# Patient Record
Sex: Female | Born: 1965 | Race: White | Hispanic: No | Marital: Married | Smoking: Never smoker
Health system: Southern US, Community
[De-identification: ages and names within clinical notes are randomized; demographics above are authoritative.]

## PROBLEM LIST (undated history)

## (undated) DIAGNOSIS — G2581 Restless legs syndrome: Principal | ICD-10-CM

## (undated) DIAGNOSIS — M26622 Arthralgia of left temporomandibular joint: Secondary | ICD-10-CM

## (undated) DIAGNOSIS — M5412 Radiculopathy, cervical region: Secondary | ICD-10-CM

## (undated) DIAGNOSIS — M50022 Cervical disc disorder at C5-C6 level with myelopathy: Secondary | ICD-10-CM

## (undated) DIAGNOSIS — S82899A Other fracture of unspecified lower leg, initial encounter for closed fracture: Secondary | ICD-10-CM

## (undated) HISTORY — DX: Restless legs syndrome: G25.81

## (undated) HISTORY — DX: Arthralgia of left temporomandibular joint: M26.622

## (undated) HISTORY — DX: Cervical disc disorder at C5-C6 level with myelopathy: M50.022

## (undated) HISTORY — DX: Radiculopathy, cervical region: M54.12

## (undated) HISTORY — DX: Other fracture of unspecified lower leg, initial encounter for closed fracture: S82.899A

## (undated) HISTORY — PX: PELVIC LAPAROSCOPY: SHX162

---

## 2004-01-04 ENCOUNTER — Ambulatory Visit: Payer: Self-pay | Admitting: Family Medicine

## 2007-01-17 ENCOUNTER — Ambulatory Visit: Payer: Self-pay | Admitting: Internal Medicine

## 2007-01-28 ENCOUNTER — Ambulatory Visit: Payer: Self-pay | Admitting: Internal Medicine

## 2007-01-28 DIAGNOSIS — R05 Cough: Secondary | ICD-10-CM

## 2007-01-28 LAB — CONVERTED CEMR LAB
Albumin: 3.6 g/dL (ref 3.5–5.2)
BUN: 11 mg/dL (ref 6–23)
Basophils Relative: 0 % (ref 0.0–1.0)
CO2: 27 meq/L (ref 19–32)
Calcium: 9.2 mg/dL (ref 8.4–10.5)
Chloride: 103 meq/L (ref 96–112)
Creatinine, Ser: 0.9 mg/dL (ref 0.4–1.2)
Eosinophils Absolute: 0.2 10*3/uL (ref 0.0–0.6)
Eosinophils Relative: 2.1 % (ref 0.0–5.0)
GFR calc Af Amer: 90 mL/min
GFR calc non Af Amer: 74 mL/min
Glucose, Bld: 88 mg/dL (ref 70–99)
MCHC: 34.9 g/dL (ref 30.0–36.0)
MCV: 90.1 fL (ref 78.0–100.0)
Neutrophils Relative %: 69.7 % (ref 43.0–77.0)
Platelets: 225 10*3/uL (ref 150–400)
RBC: 4.14 M/uL (ref 3.87–5.11)
Sodium: 139 meq/L (ref 135–145)
Total Bilirubin: 0.5 mg/dL (ref 0.3–1.2)
WBC: 7.9 10*3/uL (ref 4.5–10.5)

## 2007-01-29 ENCOUNTER — Ambulatory Visit: Payer: Self-pay | Admitting: Internal Medicine

## 2007-02-13 ENCOUNTER — Ambulatory Visit: Payer: Self-pay | Admitting: Internal Medicine

## 2007-02-14 LAB — CONVERTED CEMR LAB: Pap Smear: NORMAL

## 2007-06-14 LAB — HM MAMMOGRAPHY: HM Mammogram: NORMAL

## 2007-08-14 ENCOUNTER — Ambulatory Visit: Payer: Self-pay | Admitting: Internal Medicine

## 2007-08-14 DIAGNOSIS — R1907 Generalized intra-abdominal and pelvic swelling, mass and lump: Secondary | ICD-10-CM | POA: Insufficient documentation

## 2007-08-19 ENCOUNTER — Ambulatory Visit (HOSPITAL_BASED_OUTPATIENT_CLINIC_OR_DEPARTMENT_OTHER): Admission: RE | Admit: 2007-08-19 | Discharge: 2007-08-19 | Payer: Self-pay | Admitting: Internal Medicine

## 2007-08-19 ENCOUNTER — Encounter (INDEPENDENT_AMBULATORY_CARE_PROVIDER_SITE_OTHER): Payer: Self-pay | Admitting: *Deleted

## 2007-08-19 LAB — CONVERTED CEMR LAB
AST: 22 units/L (ref 0–37)
Basophils Absolute: 0.1 10*3/uL (ref 0.0–0.1)
Bilirubin, Direct: 0.1 mg/dL (ref 0.0–0.3)
CO2: 27 meq/L (ref 19–32)
Chloride: 108 meq/L (ref 96–112)
Creatinine, Ser: 0.9 mg/dL (ref 0.4–1.2)
Direct LDL: 123.3 mg/dL
Eosinophils Absolute: 0.1 10*3/uL (ref 0.0–0.7)
GFR calc non Af Amer: 74 mL/min
HDL: 66.8 mg/dL (ref >39.0)
MCHC: 34.9 g/dL (ref 30.0–36.0)
MCV: 91.4 fL (ref 78.0–100.0)
Monocytes Absolute: 0.4 10*3/uL (ref 0.1–1.0)
Neutro Abs: 2.2 10*3/uL (ref 1.4–7.7)
Neutrophils Relative %: 51.4 % (ref 43.0–77.0)
Potassium: 4.4 meq/L (ref 3.5–5.1)
RDW: 11.5 % (ref 11.5–14.6)
TSH: 2.22 microintl units/mL (ref 0.35–5.50)
Total Bilirubin: 0.7 mg/dL (ref 0.3–1.2)
Total Protein: 6.8 g/dL (ref 6.0–8.3)
Triglycerides: 165 mg/dL — ABNORMAL HIGH (ref 0–149)

## 2007-08-22 ENCOUNTER — Encounter: Payer: Self-pay | Admitting: Internal Medicine

## 2007-09-30 ENCOUNTER — Ambulatory Visit: Payer: Self-pay | Admitting: Family Medicine

## 2007-09-30 DIAGNOSIS — M546 Pain in thoracic spine: Secondary | ICD-10-CM | POA: Insufficient documentation

## 2008-02-14 DIAGNOSIS — S82899A Other fracture of unspecified lower leg, initial encounter for closed fracture: Secondary | ICD-10-CM

## 2008-02-14 HISTORY — DX: Other fracture of unspecified lower leg, initial encounter for closed fracture: S82.899A

## 2010-03-07 ENCOUNTER — Encounter: Payer: Self-pay | Admitting: Internal Medicine

## 2010-06-13 ENCOUNTER — Ambulatory Visit (INDEPENDENT_AMBULATORY_CARE_PROVIDER_SITE_OTHER): Payer: PRIVATE HEALTH INSURANCE | Admitting: Internal Medicine

## 2010-06-13 ENCOUNTER — Encounter: Payer: Self-pay | Admitting: Internal Medicine

## 2010-06-13 DIAGNOSIS — Z0000 Encounter for general adult medical examination without abnormal findings: Secondary | ICD-10-CM | POA: Insufficient documentation

## 2010-06-13 LAB — BASIC METABOLIC PANEL
BUN: 14 mg/dL (ref 6–23)
CO2: 26 mEq/L (ref 19–32)
Calcium: 9.1 mg/dL (ref 8.4–10.5)
Chloride: 103 mEq/L (ref 96–112)
GFR: 80.84 mL/min (ref >60.00)
Glucose, Bld: 85 mg/dL (ref 70–99)
Potassium: 5.5 mEq/L — ABNORMAL HIGH (ref 3.5–5.1)
Sodium: 139 mEq/L (ref 135–145)

## 2010-06-13 LAB — CBC WITH DIFFERENTIAL/PLATELET
Basophils Relative: 0.8 % (ref 0.0–3.0)
Eosinophils Absolute: 0.1 10*3/uL (ref 0.0–0.7)
Hemoglobin: 13.6 g/dL (ref 12.0–15.0)
MCHC: 34.6 g/dL (ref 30.0–36.0)
MCV: 91.9 fl (ref 78.0–100.0)
Monocytes Absolute: 0.4 10*3/uL (ref 0.1–1.0)
Neutro Abs: 3.2 10*3/uL (ref 1.4–7.7)
RBC: 4.26 Mil/uL (ref 3.87–5.11)

## 2010-06-13 LAB — LIPID PANEL: Cholesterol: 193 mg/dL (ref 0–200)

## 2010-06-13 LAB — TSH: TSH: 2.13 u[IU]/mL (ref 0.35–5.50)

## 2010-06-13 LAB — AST: AST: 19 U/L (ref 0–37)

## 2010-06-13 NOTE — Assessment & Plan Note (Addendum)
Td 7 GF had colon ca dx in his 45s Female care per gynecology Has a healthy lifestyle!  Palpable aorta, ultrasound 7- 07-2007 negative occ CP, atypical, EKG today wnl.Rec to call if sx increase  Labs

## 2010-06-13 NOTE — Progress Notes (Signed)
  Subjective:    Patient ID: Charlotte Petersen, female    DOB: February 06, 1966, 45 y.o.   MRN: [id number]  HPI CPX Past Medical History  Diagnosis Date  . Ankle fracture 2010    no surgery   Past Surgical History  Procedure Date  . Cesarean section   . Pelvic laparoscopy 1990s   Family History  Problem Relation Age of Onset  . Coronary artery disease Paternal Grandmother     <I  . Throat cancer Paternal Grandfather   . Colon cancer Maternal Grandmother 11  . Lupus Maternal Aunt   . Hypertension Maternal Grandmother   . Heart murmur Father     F and B (MV repair)  . Diabetes Neg Hx   . Stroke Neg Hx   . Breast cancer      aunt   . Lupus      aunt   History   Social History  . Marital Status: Married    Spouse Name: N/A    Number of Children: 2  . Years of Education: N/A   Occupational History  . ice cream maker     Social History Main Topics  . Smoking status: Never Smoker   . Smokeless tobacco: Not on file  . Alcohol Use: 0.0 oz/week     socially   . Drug Use: No  . Sexually Active: Not on file   Other Topics Concern  . Not on file   Social History Narrative   Exercise- active @ work      Review of Systems Active at work, aches and pains from time to time No  N-V- D, no blood in stools Sporadically has a anterior, non radiating, "hurt" in the midle of the chest, no radiation, no assoc. Sx, last "few seconds", usually associated to stress. Denies anxiety-depression per se  No GERD sx     Objective:   Physical Exam  Constitutional: She is oriented to person, place, and time. She appears well-developed and well-nourished. No distress.  HENT:  Head: Normocephalic and atraumatic.  Eyes: No scleral icterus.  Neck: Normal range of motion. Neck supple. No thyromegaly present.       Normal carotid pulse   Cardiovascular: Normal rate, regular rhythm and normal heart sounds.   No murmur heard. Pulmonary/Chest: Effort normal and breath sounds normal. No  respiratory distress. She has no wheezes.  Abdominal: Soft. Bowel sounds are normal. She exhibits no distension. There is no tenderness. There is no rebound.       Non tender palpable Ao on the epigastrium  Musculoskeletal: She exhibits no edema.  Neurological: She is alert and oriented to person, place, and time.  Skin: Skin is warm and dry.  Psychiatric: She has a normal mood and affect. Her behavior is normal. Judgment and thought content normal.          Assessment & Plan:

## 2010-06-15 ENCOUNTER — Telehealth: Payer: Self-pay | Admitting: *Deleted

## 2010-06-15 NOTE — Telephone Encounter (Signed)
Message copied by Army Fossa on Wed Jun 15, 2010  1:12 PM ------      Message from: Willow Ora      Created: Wed Jun 15, 2010 11:59 AM       Advise patient.      Her cholesterol is very good, just the triglycerides are slightly high. A healthy diet and daily exercise will help.      Her potassium is slightly high. Please send the patient a low potassium diet. If she is taking any "salt substitutes" or over-the-counter potassium I recommend her to stop.      Arrange for a potassium check in 2 months----- DX hyperkalemia.      All other labs are within normal.      Good results!

## 2010-06-15 NOTE — Telephone Encounter (Signed)
Message left for patient to return my call.  

## 2010-06-16 NOTE — Telephone Encounter (Signed)
Pt is aware.  

## 2010-08-12 ENCOUNTER — Other Ambulatory Visit: Payer: Self-pay | Admitting: Internal Medicine

## 2010-08-12 DIAGNOSIS — E875 Hyperkalemia: Secondary | ICD-10-CM

## 2010-08-15 ENCOUNTER — Other Ambulatory Visit (INDEPENDENT_AMBULATORY_CARE_PROVIDER_SITE_OTHER): Payer: PRIVATE HEALTH INSURANCE

## 2010-08-15 DIAGNOSIS — E875 Hyperkalemia: Secondary | ICD-10-CM

## 2010-08-15 LAB — POTASSIUM: Potassium: 4.3 mEq/L (ref 3.5–5.1)

## 2010-08-15 NOTE — Progress Notes (Signed)
Labs only

## 2010-08-16 ENCOUNTER — Telehealth: Payer: Self-pay | Admitting: *Deleted

## 2010-08-16 NOTE — Telephone Encounter (Signed)
Message left for patient to return my call.  

## 2010-08-16 NOTE — Telephone Encounter (Signed)
Message copied by Leanne Lovely on Tue Aug 16, 2010  2:03 PM ------      Message from: Willow Ora E      Created: Tue Aug 16, 2010  1:14 PM       Advise patient: K+ is ok

## 2010-08-16 NOTE — Telephone Encounter (Signed)
Pt aware of labs  

## 2010-12-27 LAB — HM PAP SMEAR

## 2012-02-14 LAB — HM MAMMOGRAPHY

## 2012-10-24 ENCOUNTER — Encounter: Payer: Self-pay | Admitting: Internal Medicine

## 2012-10-24 ENCOUNTER — Ambulatory Visit (INDEPENDENT_AMBULATORY_CARE_PROVIDER_SITE_OTHER): Payer: PRIVATE HEALTH INSURANCE | Admitting: Internal Medicine

## 2012-10-24 VITALS — BP 144/85 | HR 74 | Temp 98.7°F | Ht 62.5 in | Wt 156.8 lb

## 2012-10-24 DIAGNOSIS — Z0000 Encounter for general adult medical examination without abnormal findings: Secondary | ICD-10-CM

## 2012-10-24 DIAGNOSIS — G2581 Restless legs syndrome: Secondary | ICD-10-CM

## 2012-10-24 DIAGNOSIS — R252 Cramp and spasm: Secondary | ICD-10-CM

## 2012-10-24 HISTORY — DX: Restless legs syndrome: G25.81

## 2012-10-24 LAB — CBC WITH DIFFERENTIAL/PLATELET
Basophils Relative: 0.4 % (ref 0.0–3.0)
Eosinophils Absolute: 0.1 10*3/uL (ref 0.0–0.7)
HCT: 40.8 % (ref 36.0–46.0)
Hemoglobin: 13.9 g/dL (ref 12.0–15.0)
Lymphocytes Relative: 26.4 % (ref 12.0–46.0)
Lymphs Abs: 2.3 10*3/uL (ref 0.7–4.0)
MCHC: 34.1 g/dL (ref 30.0–36.0)
MCV: 90.6 fl (ref 78.0–100.0)
Monocytes Absolute: 0.6 10*3/uL (ref 0.1–1.0)
Neutro Abs: 5.6 10*3/uL (ref 1.4–7.7)
RBC: 4.5 Mil/uL (ref 3.87–5.11)

## 2012-10-24 LAB — COMPREHENSIVE METABOLIC PANEL
AST: 18 U/L (ref 0–37)
Alkaline Phosphatase: 41 U/L (ref 39–117)
BUN: 15 mg/dL (ref 6–23)
Creatinine, Ser: 0.8 mg/dL (ref 0.4–1.2)
Total Bilirubin: 0.7 mg/dL (ref 0.3–1.2)

## 2012-10-24 LAB — TSH: TSH: 1.42 u[IU]/mL (ref 0.35–5.50)

## 2012-10-24 LAB — LIPID PANEL: HDL: 73 mg/dL (ref >39.00)

## 2012-10-24 LAB — FOLATE: Folate: >24.8 ng/mL (ref >5.9)

## 2012-10-24 LAB — MAGNESIUM: Magnesium: 1.8 mg/dL (ref 1.5–2.5)

## 2012-10-24 MED ORDER — CLONAZEPAM 0.5 MG PO TABS: 0.5000 mg | ORAL_TABLET | Freq: Every evening | ORAL | Status: DC | PRN

## 2012-10-24 NOTE — Patient Instructions (Signed)
Take clonazepam as needed restless leg, will cause drowsiness, can not take if pregnant Get your blood work before you leave  Next visit in  2 months for a check up (RLS) Please make an appointment before you leave the office today (or call few weeks in advance)

## 2012-10-24 NOTE — Progress Notes (Signed)
  Subjective:    Patient ID: Charlotte Petersen, female    DOB: 1965/02/25, 47 y.o.   MRN: [id number]  HPI CPX  Past Medical History  Diagnosis Date  . Ankle fracture 2010    no surgery   Past Surgical History  Procedure Laterality Date  . Cesarean section    . Pelvic laparoscopy  1990s   Family History  Problem Relation Age of Onset  . Coronary artery disease Paternal Grandmother     <I  . Throat cancer Paternal Grandfather   . Colon cancer Other 88    GF  . Lupus Maternal Aunt   . Hypertension Maternal Grandmother   . Heart murmur Father     F and B (MV repair)  . Diabetes Neg Hx   . Stroke Neg Hx   . Breast cancer Other     aunt    History   Social History  . Marital Status: Married    Spouse Name: N/A    Number of Children: 2  . 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 Smoking status: Never Smoker   . Smokeless tobacco: Never Used  . Alcohol Use: 0.0 oz/week     Comment: socially   . Drug Use: No  . Sexual Activity: Not on file   Other Topics Concern  . Not on file   Social History Narrative   Live w/ husband     Review of Systems Still has "leg cramps", symptoms are almost exclusively at night described as   "can't relax her legs, blood pumps very hard"; Symptoms somehow better with Advil. No back pain, claudication. No chest pain or shortness or breath No  nausea, vomiting, diarrhea. No dysuria, gross hematuria. Also reports left tinnitus, on and off, mild, for 8 months. Denies decreased hearing.    Objective:   Physical Exam BP 144/85  Pulse 74  Temp(Src) 98.7 F (37.1 C)  Ht 5' 2.5" (1.588 m)  Wt 156 lb 12.8 oz (71.124 kg)  BMI 28.2 kg/m2  SpO2 98%  General -- alert, well-developed, NAD.  Neck --no thyromegaly , normal carotid pulse   HEENT-- Not pale. TMs no perf, no red or bulge. Lungs -- normal respiratory effort, no intercostal retractions, no accessory muscle use, and  normal breath sounds.  Heart-- normal rate, regular rhythm, no murmur.  Abdomen-- Not distended, good bowel sounds,soft, non-tender. No rebound or rigidity.  Extremities-- no pretibial edema bilaterally ; normal pedal pulses Neurologic-- alert & oriented X3. Speech, gait normal. Psych-- Cognition and judgment appear intact. Alert and cooperative with normal attention span and concentration. not anxious appearing and not depressed appearing.       Assessment & Plan:

## 2012-10-24 NOTE — Assessment & Plan Note (Addendum)
Td 05 GF had colon ca dx in his 104s Female care per gynecology, still taking her control pills, recommend to discuss with gynecology Diet-exercise discussed  Palpable aorta, ultrasound 7- 07-2007 negative tinnitus observation, if sx increase or decreased hearing--->  needs to see ENT Labs  BP 144/85, BP in the ambulatory setting reportedly 110/70.

## 2012-10-24 NOTE — Assessment & Plan Note (Addendum)
Ongoing symptoms, will check labs. CK, Mg, iron, b12, vit d Atypical RLS? Trial with clonazepam

## 2012-10-25 ENCOUNTER — Encounter: Payer: Self-pay | Admitting: Internal Medicine

## 2012-10-25 LAB — LDL CHOLESTEROL, DIRECT: Direct LDL: 119.1 mg/dL

## 2012-12-19 ENCOUNTER — Other Ambulatory Visit: Payer: Self-pay

## 2012-12-25 ENCOUNTER — Encounter: Payer: Self-pay | Admitting: Lab

## 2012-12-26 ENCOUNTER — Encounter: Payer: Self-pay | Admitting: Internal Medicine

## 2012-12-26 ENCOUNTER — Ambulatory Visit (INDEPENDENT_AMBULATORY_CARE_PROVIDER_SITE_OTHER): Payer: PRIVATE HEALTH INSURANCE | Admitting: Internal Medicine

## 2012-12-26 VITALS — BP 145/83 | HR 78 | Temp 98.3°F | Wt 161.0 lb

## 2012-12-26 DIAGNOSIS — G2581 Restless legs syndrome: Secondary | ICD-10-CM

## 2012-12-26 DIAGNOSIS — Z23 Encounter for immunization: Secondary | ICD-10-CM

## 2012-12-26 MED ORDER — CLONAZEPAM 0.5 MG PO TABS: 0.5000 mg | ORAL_TABLET | Freq: Every evening | ORAL | Status: DC | PRN

## 2012-12-26 NOTE — Patient Instructions (Addendum)
Get your UDS before you leave  Next visit in 6 months  for a RLS  follow up. No Fasting Please make an appointment

## 2012-12-26 NOTE — Assessment & Plan Note (Signed)
symptoms likely due to RLS, she responded well to clonazepam without apparent side effects. Labs requested last visit were normal. Plan: Clonazepam RF Next visit in 6 months UDS today,  Noting she has not been taking clonazepam lately because she ran out and I anticipate clonazepam will not be present in the sample

## 2012-12-26 NOTE — Progress Notes (Signed)
  Subjective:    Patient ID: Charlotte Petersen, female    DOB: 1965-03-26, 47 y.o.   MRN: [id number]  HPI F/u from previous visit RLS-- took clonazepam with excellent response of her symptoms, ran out a few weeks ago symptoms came back (not as severe as before , thinks is b/c she changed jobs and is not on her feet a lot); This time describing symptoms as cramps and twitchy  muscles    Past Medical History  Diagnosis Date  . Ankle fracture 2010    no surgery   Past Surgical History  Procedure Laterality Date  . Cesarean section    . Pelvic laparoscopy  1990s      Review of Systems Not excesively  sleepy w/  clonazepam. Ambulatory BPs within normal 120/80 or better Denies anxiety and depression per se. Still has occasional tinnitus, not severe: Decided not to see ENT.     Objective:   Physical Exam BP 145/83  Pulse 78  Temp(Src) 98.3 F (36.8 C)  Wt 161 lb (73.029 kg)  SpO2 99% General -- alert, well-developed, NAD.   Lungs -- normal respiratory effort, no intercostal retractions, no accessory muscle use, and normal breath sounds.  Heart-- normal rate, regular rhythm, no murmur.  Neurologic--  alert & oriented X3. Speech normal, gait normal, strength normal in all extremities.  Psych-- Cognition and judgment appear intact. Cooperative with normal attention span and concentration. No anxious appearing , no depressed appearing.     Assessment & Plan:

## 2012-12-26 NOTE — Progress Notes (Signed)
Pre visit review using our clinic review tool, if applicable. No additional management support is needed unless otherwise documented below in the visit note. 

## 2013-01-07 ENCOUNTER — Telehealth: Payer: Self-pay | Admitting: *Deleted

## 2013-01-07 NOTE — Telephone Encounter (Signed)
UDS results on 12/27/12 deemed Low Risk by provider

## 2013-02-10 ENCOUNTER — Telehealth: Payer: Self-pay | Admitting: *Deleted

## 2013-02-10 NOTE — Telephone Encounter (Signed)
Patient stated that she would like to stop taking the Clonazepam and would like instructions of how to take to completely stop it. Please advise.

## 2013-02-10 NOTE — Telephone Encounter (Signed)
If she is only taking 1 a day she can just stop

## 2013-02-10 NOTE — Telephone Encounter (Signed)
Patient called and stated that she is on clonazePAM (KLONOPIN) 0.5 MG tablet and read on the bottom if you want to stop taking it to let your provider know. Patient would like to see if she could stop taking it.

## 2013-02-10 NOTE — Telephone Encounter (Signed)
duplicate

## 2013-02-17 ENCOUNTER — Encounter: Payer: Self-pay | Admitting: Internal Medicine

## 2013-02-18 ENCOUNTER — Telehealth: Payer: Self-pay | Admitting: Internal Medicine

## 2013-02-18 NOTE — Telephone Encounter (Signed)
See e-mail from patient below. Charlotte Petersen call patient:  to take clonazepam half tablet each bedtime for 2 weeks and then only as needed. Call #[phone] and one refill. --------- I am wanting to stop taking the clonzepam and have 9 pills left before another refill. I am told that I cannot just stop taking it and need to wean off of this drug slowly. I don't have any side effects taking this drug, but I feel that it isn't really working and that is why I want to stop taking it. How can I go about weaning off of this controlled-substance before all 9 pills are gone?

## 2013-02-18 NOTE — Telephone Encounter (Signed)
Pt understands and agrees with plan. Pt states has two refills left.

## 2013-02-24 ENCOUNTER — Encounter: Payer: Self-pay | Admitting: Internal Medicine

## 2013-06-24 ENCOUNTER — Ambulatory Visit: Payer: PRIVATE HEALTH INSURANCE | Admitting: Internal Medicine

## 2013-08-04 ENCOUNTER — Telehealth: Payer: Self-pay | Admitting: Internal Medicine

## 2013-08-04 DIAGNOSIS — G2581 Restless legs syndrome: Secondary | ICD-10-CM

## 2013-08-04 NOTE — Telephone Encounter (Signed)
Advised marj from [facility] referrals of patient requesting to change provider for her neurologist referral to Dr. Allena KatzPatel from [place].

## 2013-08-04 NOTE — Telephone Encounter (Signed)
Please arrange a OV before referral

## 2013-08-04 NOTE — Telephone Encounter (Signed)
Caller name: Sterling BigSharon Darsey Relation to pt: patient Call back number: [phone] Pharmacy:  Reason for call: patient called to request a referral to a Neurologist. Patient feels like she may have PNH. Please advise.

## 2013-08-04 NOTE — Telephone Encounter (Signed)
Pt states she already has a referral put in for a neurologist by Dr.Manor but she doesn't want to see the P.A from [facility] that they referred her too. Pt requesting to see Dr. Allena KatzPatel in [place].pt states the clonazepam doesn't work for her RLS and makes her sleepy. Pt thinks she has PNH, she's usually up all night and okay during the day.

## 2013-08-04 NOTE — Telephone Encounter (Addendum)
Onalee Huaavid, Please enter referral

## 2013-08-04 NOTE — Telephone Encounter (Signed)
Dr Drue NovelPaz, Since this original referral was done by a doctor other than you, I will need you to put in a referral for Dr Allena KatzPatel.

## 2013-08-04 NOTE — Addendum Note (Signed)
Addended by: Eustace QuailEABOLD, DAVID J on: 08/04/2013 11:04 AM   Modules accepted: Orders

## 2013-08-04 NOTE — Telephone Encounter (Signed)
Referral ordered

## 2013-08-05 ENCOUNTER — Encounter: Payer: Self-pay | Admitting: Internal Medicine

## 2013-08-26 LAB — HM MAMMOGRAPHY

## 2013-10-09 ENCOUNTER — Ambulatory Visit (INDEPENDENT_AMBULATORY_CARE_PROVIDER_SITE_OTHER): Payer: PRIVATE HEALTH INSURANCE | Admitting: Neurology

## 2013-10-09 ENCOUNTER — Encounter: Payer: Self-pay | Admitting: Neurology

## 2013-10-09 VITALS — BP 120/84 | HR 60 | Ht 62.0 in | Wt 149.6 lb

## 2013-10-09 DIAGNOSIS — R252 Cramp and spasm: Secondary | ICD-10-CM

## 2013-10-09 DIAGNOSIS — R259 Unspecified abnormal involuntary movements: Secondary | ICD-10-CM

## 2013-10-09 DIAGNOSIS — R253 Fasciculation: Secondary | ICD-10-CM

## 2013-10-09 LAB — CALCIUM: Calcium: 9.5 mg/dL (ref 8.4–10.5)

## 2013-10-09 LAB — CK: CK TOTAL: 60 U/L (ref 7–177)

## 2013-10-09 MED ORDER — GABAPENTIN 300 MG PO CAPS: 300.0000 mg | ORAL_CAPSULE | Freq: Every day | ORAL | Status: DC

## 2013-10-09 NOTE — Patient Instructions (Addendum)
1.  Check blood work 2.  EMG of the right arm and leg 3.  Start magnesium oxide  daily for one week 4.  If no improvement with magnesium, start taking gabapentin  at bedtime 5.  Return to clinic in 79-months

## 2013-10-09 NOTE — Progress Notes (Signed)
[facility] Clinic Note - Initial Visit   Date: 10/09/2013  [name] MRN: [id number] DOB: Mar 09, 1965   Dear Dr Drue Novel:   Thank you for your kind referral of Charlotte Petersen for consultation of muscle cramps. Although his history is well known to you, please allow [place] to reiterate it for the purpose of our medical record. The patient was accompanied to the clinic by husband who also provides collateral information.     History of Present Illness: Charlotte Petersen is a 48 y.o. left-handed Caucasian female with no prior medical history presenting for evaluation of muscle cramps and twitches.    Starting in 2010, she developed cramping of the legs (thigh, calf, and feet).  She was working at [employer] and thought it was because she was on her feet all day.  In October 2014, she transitioned jobs to a desk position at [employer] that it would help her symptoms, but there has been no difference.  She complains of achy legs, painless muscle twitches, and muscle cramps (occuring at night).  She feels as if she has a "bag of worms" in her legs.  Symptoms are more noticable at rest, but is not aggravated by rest.  She wakes up around 2am with muscle cramps.  She usually gets up to walk around and tries to stretch the muscles.  She has no problems falling asleep. She does not have the urge to move her legs or restlessness.  Symptoms are improved by a hot shower.  She has tried doing posterior leg stretches, but this has no helped.  She was given a trial of clonazepam, which only made her tired without any change in her muscle cramps. She tried drinking tonic water but stopped because her potassium increased and does not recall if it helped. No numbness/tingling or weakness of the legs.  She has rare muscle twitches of the arms.  Her mother and brother have similar symptoms, but mother's cramps resolved after back surgery.  She has lost 14lb since  January by following diet program.   Out-side paper records, electronic medical record, and images have been reviewed where available and summarized as:  Lab Results  Component Value Date   TSH 1.42 10/24/2012   Lab Results  Component Value Date   VITAMINB12 674 10/24/2012   Lab Results  Component Value Date   CKTOTAL 116 10/24/2012      Past Medical History  Diagnosis Date  . Ankle fracture 2010    no surgery  . RLS (restless legs syndrome) 10/24/2012    Past Surgical History  Procedure Laterality Date  . Cesarean section    . Pelvic laparoscopy  1990s     Medications:  Current Outpatient Prescriptions on File Prior to Visit  Medication Sig Dispense Refill  . b complex vitamins tablet Take 1 tablet by mouth daily.        . drospirenone-ethinyl estradiol (YASMIN) 3-0.03 MG per tablet Take 1 tablet by mouth daily.        . Ibuprofen (ADVIL) 200 MG CAPS Take by mouth.      . Lysine 500 MG CAPS Take by mouth.        Marland Kitchen VITAMIN D, CHOLECALCIFEROL, PO Take 1,000 mg by mouth.       No current facility-administered medications on file prior to visit.    Allergies: No Known Allergies  Family History: Family History  Problem Relation Age of Onset  . Coronary artery disease Paternal  Grandmother     <I  . Throat cancer Paternal Grandfather   . Colon cancer Other 88    GF  . Lupus Maternal Aunt   . Hypertension Maternal Grandmother   . Heart murmur Father     F and B (MV repair)  . Diabetes Neg Hx   . Stroke Neg Hx   . Breast cancer Other     aunt   . Arthritis Mother     Social History: History   Social History  . Marital Status: Married    Spouse Name: N/A    Number of Children: 2  . 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 Smoking status: Never Smoker   . Smokeless tobacco: Never Used  . Alcohol Use: 0.0 oz/week     Comment: socially   . Drug Use: No  . Sexual Activity: Not on file    Other Topics Concern  . Not on file   Social History Narrative   Live w/ husband    She works as Futures trader as a Software engineer.    Review of Systems:  CONSTITUTIONAL: No fevers, chills, night sweats, + 14lb intentional weight loss.   EYES: No visual changes or eye pain ENT: No hearing changes.  No history of nose bleeds.   RESPIRATORY: No cough, wheezing and shortness of breath.   CARDIOVASCULAR: Negative for chest pain, and palpitations.   GI: Negative for abdominal discomfort, blood in stools or black stools.  No recent change in bowel habits.   GU:  No history of incontinence.   MUSCLOSKELETAL: No history of joint pain or swelling.  No myalgias.   SKIN: Negative for lesions, rash, and itching.   HEMATOLOGY/ONCOLOGY: Negative for prolonged bleeding, bruising easily, and swollen nodes.  No history of cancer.   ENDOCRINE: Negative for cold or heat intolerance, polydipsia or goiter.   PSYCH:  No depression +anxiety symptoms.   NEURO: As Above.   Vital Signs:  BP 120/84  Pulse 60  Ht  (1.575 m)  Wt 149 lb 9 oz (67.841 kg)  BMI 27.35 kg/m2  SpO2 99% Pain Scale: 0 on a scale of 0-10   General Medical Exam:   General:  Well appearing, comfortable.   Eyes/ENT: see cranial nerve examination.   Neck: No masses appreciated.  Full range of motion without tenderness.  No carotid bruits. Respiratory:  Clear to auscultation, good air entry bilaterally.   Cardiac:  Regular rate and rhythm, no murmur.     Extremities:  No deformities, edema, or skin discoloration. Good capillary refill.   Skin:  Skin color, texture, turgor normal. No rashes or lesions.  Neurological Exam: MENTAL STATUS including orientation to time, place, person, recent and remote memory, attention span and concentration, language, and fund of knowledge is normal.  Speech is not dysarthric.  CRANIAL NERVES: II:  No visual field defects.  Unremarkable fundi.   III-IV-VI: Pupils equal round  and reactive to light.  Normal conjugate, extra-ocular eye movements in all directions of gaze.  No nystagmus.  No ptosis. No eyelid myotonia. V:  Normal facial sensation.  Jaw jerk is absent   VII:  Normal facial symmetry and movements.  No pathologic facial reflexes.  VIII:  Normal hearing and vestibular function.   IX-X:  Normal palatal movement.   XI:  Normal shoulder shrug and head rotation.   XII:  Normal tongue strength and  range of motion, no deviation or fasciculation.  MOTOR:  No atrophy or fasciculations.  Right quadriceps cramp, lasting a few seconds, provoked by quadriceps testing. No pronator drift.  Tone is normal.  No percussion myotonia.  Right Upper Extremity:    Left Upper Extremity:    Deltoid  5/5   Deltoid  5/5   Biceps  5/5   Biceps  5/5   Triceps  5/5   Triceps  5/5   Wrist extensors  5/5   Wrist extensors  5/5   Wrist flexors  5/5   Wrist flexors  5/5   Finger extensors  5/5   Finger extensors  5/5   Finger flexors  5/5   Finger flexors  5/5   Dorsal interossei  5/5   Dorsal interossei  5/5   Abductor pollicis  5/5   Abductor pollicis  5/5   Tone (Ashworth scale)  0  Tone (Ashworth scale)  0   Right Lower Extremity:    Left Lower Extremity:    Hip flexors  5/5   Hip flexors  5/5   Hip extensors  5/5   Hip extensors  5/5   Knee flexors  5/5   Knee flexors  5/5   Knee extensors  5/5   Knee extensors  5/5   Dorsiflexors  5/5   Dorsiflexors  5/5   Plantarflexors  5/5   Plantarflexors  5/5   Toe extensors  5/5   Toe extensors  5/5   Toe flexors  5/5   Toe flexors  5/5   Tone (Ashworth scale)  0  Tone (Ashworth scale)  0   MSRs:  Right                                                                 Left brachioradialis 2+  brachioradialis 2+  biceps 2+  biceps 2+  triceps 2+  triceps 2+  patellar 2+  patellar 2+  ankle jerk 2+  ankle jerk 2+  Hoffman no  Hoffman no  plantar response down  plantar response down   SENSORY:  Normal and symmetric perception  of light touch, pinprick, vibration, and proprioception.  Romberg's sign absent.   COORDINATION/GAIT: Normal finger-to- nose-finger and heel-to-shin.  Intact rapid alternating movements bilaterally.  Able to rise from a chair without using arms.  Gait narrow based and stable. Tandem and stressed gait intact.    IMPRESSION/PLAN: Mrs. Ingles is a 48 year-old female presenting for 5-year history of muscle cramps and twitches involving her legs.  Neurological examination is normal and non-lateralizing.  She did have one muscle cramp during the exam involving the right vastus lateralis.  I did not see any fasciculations on exam.    She is not taking any medications which could potentially cause muscle cramps. 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  I would like to obtain EMG of the right arm and leg to be sure other nerve hyperexcitability syndrome is not missed.  She has already had testing for TSH, CK, vitamin B12, CK, CMP, and Mg which was normal in 2014. For completeness, I will check PTH, vitamin E, and repeat CK as well as Ca.  Regarding symptom management, I recommended she start MgO  daily.  If  no improvement, then take gabapentin  qhs.  Other options:  elavil 25-100 q HS, Riboflavin , Benadryl , flexaril 5 mg at night, carbamazepine.  I will see her back after the above testing.   The duration of this appointment visit was 45 minutes of face-to-face time with the patient.  Greater than 50% of this time was spent in counseling, explanation of diagnosis, planning of further management, and coordination of care.   Thank you for allowing me to participate in patient's care.  If I can answer any additional questions, I would be pleased to do so.    Sincerely,    Donika K. Allena Katz, DO

## 2013-10-10 LAB — PARATHYROID HORMONE, INTACT (NO CA): PTH: 36 pg/mL (ref 14–64)

## 2013-10-11 LAB — VITAMIN E
Gamma-Tocopherol (Vit E): <0.2 mg/L (ref <=4.3)
VITAMIN E (ALPHA TOCOPHEROL): 19.7 mg/L (ref 5.7–19.9)

## 2013-11-26 ENCOUNTER — Ambulatory Visit (INDEPENDENT_AMBULATORY_CARE_PROVIDER_SITE_OTHER): Payer: PRIVATE HEALTH INSURANCE | Admitting: Neurology

## 2013-11-26 DIAGNOSIS — R252 Cramp and spasm: Secondary | ICD-10-CM

## 2013-11-26 DIAGNOSIS — R253 Fasciculation: Secondary | ICD-10-CM

## 2013-11-26 NOTE — Procedures (Signed)
[facility]  [address], [address]  [place], [place] [address] Tel: [phone] Fax:  [phone] Test Date:  11/26/2013  Patient: Charlotte Petersen DOB: 11/29/1965 Physician: Nita Sickleonika Patel, DO  Sex: Female Height: 5\' 2"  Ref Phys: Nita Sickleatel, Donika  ID#: [id number] Temp: 34.0C Technician: Ala BentSusan Reid R. NCS T.   Patient Complaints: Patient is a 48 year old female presenting for evaluation of generalized muscle twitching and cramping.   NCV & EMG Findings: Extensive electrodiagnostic testing of the left upper and lower extremity reveals:  1. Median, ulnar, radial, and palmar sensory responses are within normal limits.  2. Median and ulnar motor responses are within normal limits.  3. Sural and superficial peroneal sensory responses are within normal limits.  4. Tibial and peroneal motor responses are within normal limits.  5. There is no evidence of active or chronic motor axon loss changes affecting any of the tested muscles. Motor unit configuration and recruitment pattern is within normal limits.  6. Fasciculation potentials are not seen in any of the tested muscles.  Impression: This is a normal study of the left upper and lower extremity. In particular, there is no evidence of a generalized polyneuropathy, diffuse myopathy, or hyperexcitability syndrome.   ___________________________ Nita Sickleonika Patel, DO    Nerve Conduction Studies Anti Sensory Summary Table   Site NR Peak (ms) Norm Peak (ms) P-T Amp (V) Norm P-T Amp  Left Median Anti Sensory (2nd Digit)  34C  Wrist    2.8 <3.4 34.0 >20  Left Radial Anti Sensory (Base 1st Digit)  34C  Wrist    2.1 <2.7 28.6 >18  Left Sup Peroneal Anti Sensory (Ant Lat Mall)  32C  12 cm    2.3 <4.5 11.4 >5  Left Sural Anti Sensory (Lat Mall)  Calf    3.6 <4.5 11.8 >5  Left Ulnar Anti Sensory (5th Digit)  34C  Wrist    2.5 <3.1 29.3 >12   Motor Summary Table   Site NR Onset (ms) Norm Onset (ms) O-P Amp (mV) Norm O-P Amp  Site1 Site2 Delta-0 (ms) Dist (cm) Vel (m/s) Norm Vel (m/s)  Left Median Motor (Abd Poll Brev)  34C  Wrist    2.5 <3.9 11.3 >6 Elbow Wrist 4.1 24.0 59 >50  Elbow    6.6  10.3         Left Peroneal Motor (Ext Dig Brev)  32C  Ankle    3.9 <5.5 5.4 >3 B Fib Ankle 5.7 27.0 47 >40  B Fib    9.6  5.0  Poplt B Fib 1.8 10.0 56 >40  Poplt    11.4  5.0         Left Peroneal TA Motor (Tib Ant)  34C  Fib Head    2.8 <4.0 5.3 >4 Poplit Fib Head 1.6 8.0 50 >40  Poplit    4.4  5.3         Left Tibial Motor (Abd Hall Brev)  32C  Ankle    3.9 <6.0 11.2 >8 Knee Ankle 7.9 34.0 43 >40  Knee    11.8  7.6         Left Ulnar Motor (Abd Dig Minimi)  34C  Wrist    2.0 <3.1 12.1 >7 B Elbow Wrist 3.2 20.5 64 >50  B Elbow    5.2  11.6  A Elbow B Elbow 1.4 10.0 71 >50  A Elbow    6.6  11.1  Comparison Summary Table   Site NR Peak (ms) Norm Peak (ms) P-T Amp (V) Site1 Site2 Delta-P (ms) Norm Delta (ms)  Left Median/Ulnar Palm Comparison (Wrist - 8cm)  34C  Median Palm    1.9 <2.2 39.8 Median Palm Ulnar Palm 0.2   Ulnar Palm    1.7 <2.2 26.7       EMG   Side Muscle Ins Act Fibs Psw Fasc Number Recrt Dur Dur. Amp Amp. Poly Poly. Comment  Left AntTibialis Nml Nml Nml Nml Nml Nml Nml Nml Nml Nml Nml Nml N/A  Left Gastroc Nml Nml Nml Nml Nml Nml Nml Nml Nml Nml Nml Nml N/A  Left Flex Dig Long Nml Nml Nml Nml Nml Nml Nml Nml Nml Nml Nml Nml N/A  Left RectFemoris Nml Nml Nml Nml Nml Nml Nml Nml Nml Nml Nml Nml N/A  Left Lumbo Parasp Low Nml Nml Nml Nml Nml Nml Nml Nml Nml Nml Nml Nml N/A  Left GluteusMed Nml Nml Nml Nml Nml Nml Nml Nml Nml Nml Nml Nml N/A  Left Cervical Parasp Low Nml Nml Nml Nml Nml Nml Nml Nml Nml Nml Nml Nml N/A  Left 1stDorInt Nml Nml Nml Nml Nml Nml Nml Nml Nml Nml Nml Nml N/A  Left Ext Indicis Nml Nml Nml Nml Nml Nml Nml Nml Nml Nml Nml Nml N/A  Left PronatorTeres Nml Nml Nml Nml Nml Nml Nml Nml Nml Nml Nml Nml N/A  Left Biceps Nml Nml Nml Nml Nml Nml Nml Nml Nml Nml Nml Nml  N/A  Left Triceps Nml Nml Nml Nml Nml Nml Nml Nml Nml Nml Nml Nml N/A  Left Deltoid Nml Nml Nml Nml Nml Nml Nml Nml Nml Nml Nml Nml N/A      Waveforms:

## 2013-12-15 ENCOUNTER — Encounter: Payer: Self-pay | Admitting: Neurology

## 2013-12-17 ENCOUNTER — Ambulatory Visit (INDEPENDENT_AMBULATORY_CARE_PROVIDER_SITE_OTHER): Payer: PRIVATE HEALTH INSURANCE | Admitting: Neurology

## 2013-12-17 ENCOUNTER — Encounter: Payer: Self-pay | Admitting: Neurology

## 2013-12-17 DIAGNOSIS — G709 Myoneural disorder, unspecified: Secondary | ICD-10-CM

## 2013-12-17 DIAGNOSIS — R253 Fasciculation: Secondary | ICD-10-CM

## 2013-12-17 NOTE — Patient Instructions (Addendum)
1.  Continue gabapentin 300mg  at bedtime and MgO 400mg  every other day 2.  You can try to use heating pad at night time to see if this helps 3.  Return to clinic in 2-3 months

## 2013-12-17 NOTE — Progress Notes (Signed)
Follow-up Visit   Date: 12/17/2013    Charlotte Petersen: [id number] DOB: 09/20/1965   Interim History: Charlotte Petersen with no prior medical history returning to the clinic for follow-up of muscle cramps and twitches.    History of present illness: Starting in 2010, she developed cramping of the legs (thigh, calf, and feet). She was working at [employer] and thought it was because she was on her feet all day. 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  She complains of achy legs, painless muscle twitches, and muscle cramps (occuring at night). She feels as if she has a "bag of worms" in her legs. Symptoms are more noticable at rest, but is not aggravated by rest. She wakes up around 2am with muscle cramps. She usually gets up to walk around and tries to stretch the muscles. She has no problems falling asleep. She does not have the urge to move her legs or restlessness. Symptoms are improved by a hot shower. She has tried doing posterior leg stretches, but this has no helped. She was given a trial of clonazepam, which only made her tired without any change in her muscle cramps. She tried drinking tonic water but stopped because her potassium increased and does not recall if it helped. No numbness/tingling or weakness of the legs. She has rare muscle twitches of the arms.  Her mother and brother have similar symptoms, but mother's cramps resolved after back surgery.  UPDATE 12/17/2013:  She delayed taking her gabapentin until after her son's wedding which was 2 weeks ago.  She has noticed that the "worm sensation" has reduced during the day and is able to manage her cramps better at night time.  Previously she was getting up out of bed several times per night and now she is getting up 1-2 times per week.  She has noticed  intensity of symptoms are reduced (9/10 >> 7/10).  EMG of the left side was normal.     Medications:  Current Outpatient Prescriptions on File Prior to Visit  Medication Sig Dispense Refill  . b complex vitamins tablet Take 1 tablet by mouth daily.      . drospirenone-ethinyl estradiol (YASMIN) 3-0.03 MG per tablet Take 1 tablet by mouth daily.      Marland Kitchen. gabapentin (NEURONTIN) 300 MG capsule Take 1 capsule (300 mg total) by mouth at bedtime. 30 capsule 3  . Ibuprofen (ADVIL) 200 MG CAPS Take by mouth.    . Lysine 500 MG CAPS Take by mouth.      Marland Kitchen. VITAMIN D, CHOLECALCIFEROL, PO Take 1,000 mg by mouth.     No current facility-administered medications on file prior to visit.    Allergies: No Known Allergies   Review of Systems:  CONSTITUTIONAL: No fevers, chills, night sweats, or weight loss.   EYES: No visual changes or eye pain ENT: No hearing changes.  No history of nose bleeds.   RESPIRATORY: No cough, wheezing and shortness of breath.   CARDIOVASCULAR: Negative for chest pain, and palpitations.   GI: Negative for abdominal discomfort, blood in stools or black stools.  No recent change in bowel habits.   GU:  No history of incontinence.   MUSCLOSKELETAL: No history of joint pain or swelling.  No myalgias.   SKIN: Negative for lesions, rash, and itching.   ENDOCRINE:  Negative for cold or heat intolerance, polydipsia or goiter.   PSYCH:  + depression or anxiety symptoms.   NEURO: As Above.   Vital Signs:  BP 128/84 mmHg  Pulse 69  Ht 5\' 2"  (1.575 m)  Wt 155 lb (70.308 kg)  BMI 28.34 kg/m2  SpO2 98%  Neurological Exam: MENTAL STATUS including orientation to time, place, person, recent and remote memory, attention span and concentration, language, and fund of knowledge is normal.  Speech is not dysarthric.  CRANIAL NERVES:  Pupils equal round and reactive to light.  Normal conjugate, extra-ocular eye movements in all directions of gaze.  No ptosis.  Face is symmetric.    MOTOR:  Motor strength is 5/5 in all extremities.  No fasciculations seen on today's visit.  MSRs:  Reflexes are 2+/4 throughout.  COORDINATION/GAIT:  Gait narrow based and stable.   Data: EMG 11/26/2013:  This is a normal study of the left upper and lower extremity. In particular, there is no evidence of a generalized polyneuropathy, diffuse myopathy, or hyperexcitability syndrome.  Labs 10/09/2013:  Vitamin E 0.2, PTH 36, CK 60, calcium 9.5   Lab Results  Component Value Date   TSH 1.42 10/24/2012   Lab Results  Component Value Date   VITAMINB12 674 10/24/2012   Lab Results  Component Value Date   CKTOTAL 60 10/09/2013     IMPRESSION/PLAN: Charlotte Petersen is a 48 year-old Petersen presenting for 5-year history of muscle cramps and twitches involving her legs. Neurological examination shows generalized and symmetric hyperreflexia throughout, which is most likely normal for patient in the absence of upper motor neuron findings.  No evidence of fasciculations on cramps on today's exam.  Her EMG was normal and did not show evidence of membrane hyperexcitability.  Laboratory testing including TSH, CK, vitamin B12, CMP, Mg, PTH, Ca, and vitamin E is also normal.  Since starting gabapentin, she has noticed improvement of fasciculatons and cramps.  I offered to increase the dose, but due to morning cognitive effects, she would like to stay on gabapentin 300mg  at bedtime.   Going forward, would titrate gabapentin slowly using 100mg  increments.   Other options: elavil 25-100 q HS, Riboflavin 100mg , Benadryl 25mg , flexaril 5 mg at night, carbamazepine.  For now, continue gabapentin 300mg  at bedtime and MgO 400mg  every other day.  Recommended trial of heating pad at night.  Return to clinic in 503-months, if there is worsening of any symptoms consider MRI brain going forward.   The duration of this appointment visit was 25 minutes of face-to-face time with the patient.  Greater than 50% of  this time was spent in counseling, explanation of diagnosis, planning of further management, and coordination of care.   Thank you for allowing me to participate in patient's care.  If I can answer any additional questions, I would be pleased to do so.    Sincerely,    Pranathi Winfree K. Allena KatzPatel, DO

## 2014-03-19 ENCOUNTER — Ambulatory Visit (INDEPENDENT_AMBULATORY_CARE_PROVIDER_SITE_OTHER): Payer: PRIVATE HEALTH INSURANCE | Admitting: Neurology

## 2014-03-19 ENCOUNTER — Encounter: Payer: Self-pay | Admitting: Neurology

## 2014-03-19 VITALS — BP 130/78 | HR 62 | Ht 62.0 in | Wt 158.0 lb

## 2014-03-19 DIAGNOSIS — R253 Fasciculation: Secondary | ICD-10-CM

## 2014-03-19 DIAGNOSIS — G709 Myoneural disorder, unspecified: Secondary | ICD-10-CM

## 2014-03-19 DIAGNOSIS — Z79899 Other long term (current) drug therapy: Secondary | ICD-10-CM

## 2014-03-19 MED ORDER — GABAPENTIN 100 MG PO CAPS: 100.0000 mg | ORAL_CAPSULE | Freq: Two times a day (BID) | ORAL | Status: DC

## 2014-03-19 MED ORDER — GABAPENTIN 300 MG PO CAPS: 300.0000 mg | ORAL_CAPSULE | Freq: Every day | ORAL | Status: DC

## 2014-03-19 NOTE — Progress Notes (Signed)
Follow-up Visit   Date: 03/19/2014    Charlotte PlanasSharon L Cotterill MRN: [id number] DOB: 02/19/1965   Interim History: Charlotte Petersen is a 49 y.o. left-handed Caucasian female with no prior medical history returning to the clinic for follow-up of muscle cramps and twitches.    History of present illness: Starting in 2010, she developed cramping of the legs (thigh, calf, and feet). She was working at [employer] and thought it was because she was on her feet all day. 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  She complains of achy legs, painless muscle twitches, and muscle cramps (occuring at night). She feels as if she has a "bag of worms" in her legs. Symptoms are more noticable at rest, but is not aggravated by rest. She wakes up around 2am with muscle cramps. She usually gets up to walk around and tries to stretch the muscles. She has no problems falling asleep. She does not have the urge to move her legs or restlessness. Symptoms are improved by a hot shower. She has tried doing posterior leg stretches, but this has no helped. She was given a trial of clonazepam, which only made her tired without any change in her muscle cramps. She tried drinking tonic water but stopped because her potassium increased and does not recall if it helped. No numbness/tingling or weakness of the legs. She has rare muscle twitches of the arms.  Her mother and brother have similar symptoms, but mother's cramps resolved after back surgery.  Follow-up 12/17/2013:  She delayed taking her gabapentin until after her son's wedding which was 2 weeks ago.  She has noticed that the "worm sensation" has reduced during the day and is able to manage her cramps better at night time.  Previously she was getting up out of bed several times per night and now she is getting up 1-2 times per week.  She has noticed  intensity of symptoms are reduced (9/10 >> 7/10).  EMG of the left side was normal.    UPDATE 03/19/2014:  Up until three weeks ago, she was doing well but has noticed cramps waking her up from sleeping again.  The intensity of her symptoms is back up to 8.  She is waking up 3-4 times per night.  She no longer as increased sedation with gabapentin.  She continues to have fasciculations, but says that it is more annoying than anything else.  No new weakness or falls.     Medications:  Current Outpatient Prescriptions on File Prior to Visit  Medication Sig Dispense Refill  . b complex vitamins tablet Take 1 tablet by mouth daily.      . drospirenone-ethinyl estradiol (YASMIN) 3-0.03 MG per tablet Take 1 tablet by mouth daily.      . Ibuprofen (ADVIL) 200 MG CAPS Take by mouth.    . Lysine 500 MG CAPS Take by mouth.      Marland Kitchen. VITAMIN D, CHOLECALCIFEROL, PO Take 1,000 mg by mouth.     No current facility-administered medications on file prior to visit.    Allergies: No Known Allergies   Review of Systems:  CONSTITUTIONAL: No fevers, chills, night sweats, or weight loss.   EYES: No visual changes or eye pain ENT: No hearing changes.  No history of nose bleeds.   RESPIRATORY: No cough, wheezing and shortness of breath.   CARDIOVASCULAR: Negative for chest  pain, and palpitations.   GI: Negative for abdominal discomfort, blood in stools or black stools.  No recent change in bowel habits.   GU:  No history of incontinence.   MUSCLOSKELETAL: No history of joint pain or swelling.  No myalgias.   SKIN: Negative for lesions, rash, and itching.   ENDOCRINE: Negative for cold or heat intolerance, polydipsia or goiter.   PSYCH:  + depression or anxiety symptoms.   NEURO: As Above.   Vital Signs:  BP 130/78 mmHg  Pulse 62  Ht  (1.575 m)  Wt 158 lb (71.668 kg)  BMI 28.89 kg/m2  SpO2 99%  Neurological Exam: MENTAL STATUS including orientation to time, place, person, recent and remote memory,  attention span and concentration, language, and fund of knowledge is normal.  Speech is not dysarthric.  CRANIAL NERVES:  Pupils equal round and reactive to light.  No ptosis.  Face is symmetric.   MOTOR:  Motor strength is 5/5 in all extremities.  No fasciculations.  MSRs:  Reflexes are 2+/4 throughout.  COORDINATION/GAIT:  Gait narrow based and stable.   Data: EMG 11/26/2013:  This is a normal study of the left upper and lower extremity. In particular, there is no evidence of a generalized polyneuropathy, diffuse myopathy, or hyperexcitability syndrome.  Labs 10/09/2013:  Vitamin E 0.2, PTH 36, CK 60, calcium 9.5   Lab Results  Component Value Date   TSH 1.42 10/24/2012   Lab Results  Component Value Date   VITAMINB12 674 10/24/2012   Lab Results  Component Value Date   CKTOTAL 60 10/09/2013     IMPRESSION/PLAN: Mrs. 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 Neurological examination shows generalized and symmetric hyperreflexia throughout, which is most likely normal for patient in the absence of upper motor neuron findings.  No evidence of fasciculations on cramps.  Her EMG was normal and did not show evidence of membrane hyperexcitability.  Laboratory testing including TSH, CK, vitamin B12, CMP, Mg, PTH, Ca, and vitamin E is also normal.  Since starting gabapentin, she has noticed improvement of fasciculatons and cramps.  Given worsening of symptoms over the past few weeks, will slowly uptitrate her gabapentin to  qhs and add  to the morning (schedule provided).  She is taking magnesium as needed because of GI side effects.  Other options: elavil 25-100 q HS, Riboflavin , Benadryl , flexaril 5 mg at night, carbamazepine.  Return to clinic in 22-months, if there is worsening of any symptoms consider MRI brain going forward.   The duration of this appointment visit was 25 minutes of face-to-face time with  the patient.  Greater than 50% of this time was spent in counseling, explanation of diagnosis, planning of further management, and coordination of care.   Thank you for allowing me to participate in patient's care.  If I can answer any additional questions, I would be pleased to do so.    Sincerely,    Donika K. Allena Katz, DO

## 2014-03-19 NOTE — Patient Instructions (Addendum)
Gabapentin as follows tablets    Morning              Evening  Week 1                                  400 mg             Week 2                     500 mg              Week 3             600 mg  Week 4 100mg            600mg            Call with update in 6 weeks , to determine further increase in medication.  If you develop increased sleepiness, stay at the lower dose.  Stay at the dose that gives you most benefit.     Return to clinic in 4 months

## 2014-04-27 ENCOUNTER — Encounter: Payer: Self-pay | Admitting: Neurology

## 2014-05-28 ENCOUNTER — Encounter: Payer: Self-pay | Admitting: Family Medicine

## 2014-05-28 ENCOUNTER — Ambulatory Visit (INDEPENDENT_AMBULATORY_CARE_PROVIDER_SITE_OTHER): Payer: PRIVATE HEALTH INSURANCE | Admitting: Family Medicine

## 2014-05-28 VITALS — BP 122/80 | HR 66 | Temp 98.4°F | Resp 16 | Wt 159.0 lb

## 2014-05-28 DIAGNOSIS — J321 Chronic frontal sinusitis: Secondary | ICD-10-CM | POA: Insufficient documentation

## 2014-05-28 DIAGNOSIS — J0110 Acute frontal sinusitis, unspecified: Secondary | ICD-10-CM

## 2014-05-28 MED ORDER — AMOXICILLIN 875 MG PO TABS: 875.0000 mg | ORAL_TABLET | Freq: Two times a day (BID) | ORAL | Status: DC

## 2014-05-28 NOTE — Progress Notes (Signed)
Pre visit review using our clinic review tool, if applicable. No additional management support is needed unless otherwise documented below in the visit note. 

## 2014-05-28 NOTE — Progress Notes (Signed)
   Subjective:    Patient ID: Charlotte Petersen, female    DOB: 04/14/1965, 49 y.o.   MRN: [id number]  HPI URI- pt reports nausea at work yesterday.  This improved but returned overnight and has persisted all AM.  + chills.  + frontal HA.  L ear pain.  + PND.  + sick contacts.  Denies cough.  + facial pain and dizziness w/ leaning forward.   Review of Systems For ROS see HPI     Objective:   Physical Exam  Constitutional: She appears well-developed and well-nourished. No distress.  HENT:  Head: Normocephalic and atraumatic.  Right Ear: Tympanic membrane normal.  Left Ear: Tympanic membrane normal.  Nose: Mucosal edema and rhinorrhea present. Right sinus exhibits maxillary sinus tenderness and frontal sinus tenderness. Left sinus exhibits maxillary sinus tenderness and frontal sinus tenderness.  Mouth/Throat: Uvula is midline and mucous membranes are normal. Posterior oropharyngeal erythema present. No oropharyngeal exudate.  Eyes: Conjunctivae and EOM are normal. Pupils are equal, round, and reactive to light.  Neck: Normal range of motion. Neck supple.  Cardiovascular: Normal rate, regular rhythm and normal heart sounds.   Pulmonary/Chest: Effort normal and breath sounds normal. No respiratory distress. She has no wheezes.  Lymphadenopathy:    She has no cervical adenopathy.  Vitals reviewed.         Assessment & Plan:

## 2014-05-28 NOTE — Assessment & Plan Note (Signed)
Pt's PE consistent w/ sinus infxn.  Start abx.  Start daily OTC antihistamine and start short term decongestant to improve congestion.  Reviewed supportive care and red flags that should prompt return.  Pt expressed understanding and is in agreement w/ plan.

## 2014-05-28 NOTE — Patient Instructions (Signed)
Follow up as needed Start the Amoxicillin twice daily- take w/ food Drink plenty of fluids Add OTC antihistamine like Claritin or Zyrtec daily If able to tolerate, add OTC decongestant (either sudafed or phenylephrine) for 3-5 days REST!!! Call with any questions or concerns Hang in there!!!

## 2014-06-16 ENCOUNTER — Telehealth: Payer: Self-pay | Admitting: Internal Medicine

## 2014-06-16 NOTE — Telephone Encounter (Signed)
Needs immunization record

## 2014-06-16 NOTE — Telephone Encounter (Signed)
Immunization record printed and placed at front desk for pick up. Pt is also due for Follow up with Dr. Drue NovelPaz.

## 2014-08-03 ENCOUNTER — Ambulatory Visit: Payer: PRIVATE HEALTH INSURANCE | Admitting: Neurology

## 2014-08-28 LAB — CBC AND DIFFERENTIAL
HEMATOCRIT: 40 % (ref 36–46)
Hemoglobin: 13.4 g/dL (ref 12.0–16.0)
Platelets: 274 10*3/uL (ref 150–399)
WBC: 10.2 10^3/mL

## 2014-08-28 LAB — TSH: TSH: 1.99 u[IU]/mL (ref <=5.90)

## 2014-09-01 LAB — HM PAP SMEAR

## 2014-09-18 LAB — HM MAMMOGRAPHY

## 2014-12-21 ENCOUNTER — Ambulatory Visit (INDEPENDENT_AMBULATORY_CARE_PROVIDER_SITE_OTHER): Payer: PRIVATE HEALTH INSURANCE | Admitting: Internal Medicine

## 2014-12-21 ENCOUNTER — Encounter: Payer: Self-pay | Admitting: Internal Medicine

## 2014-12-21 VITALS — BP 150/80 | HR 69 | Temp 98.2°F | Wt 162.0 lb

## 2014-12-21 DIAGNOSIS — H9202 Otalgia, left ear: Secondary | ICD-10-CM

## 2014-12-21 DIAGNOSIS — Z09 Encounter for follow-up examination after completed treatment for conditions other than malignant neoplasm: Secondary | ICD-10-CM

## 2014-12-21 NOTE — Patient Instructions (Signed)
Tylenol as needed   if cough: Take Mucinex DM twice a day as needed until better  For nasal congestion Use OTC   Flonase : 2 nasal sprays on each side of the nose daily until you feel better  Get pseudoephedrine 30 mg (behind the counter, you need to talk with the pharmacist) take one tablet 3 or 4 times a day as needed for congestion  Call if not gradually better over the next  10 days  Call anytime if the symptoms are severe

## 2014-12-21 NOTE — Assessment & Plan Note (Signed)
L Otalgia  No infection that I can tell, pain  from congestion?. Recommend conservative treatment with Flonase, pseudoephedrine.(BP initially elevated, repeated it was normal)

## 2014-12-21 NOTE — Progress Notes (Signed)
   Subjective:    Patient ID: Charlotte Petersen, female    DOB: 02/07/1966, 49 y.o.   MRN: [id number]  DOS:  12/21/2014 Type of visit - description : Acute  Interval history: Symptoms started yesterday with left ear pain, throbbing, on and off. She took some Advil with modest help. Today she still has the pain is actually radiating down to the tonsils area.   Review of Systems Denies fever chills. + Mild runny nose, no sinus pain or congestion. No TMJ pain. No ear discharge.  Past Medical History  Diagnosis Date  . Ankle fracture 2010    no surgery  . RLS (restless legs syndrome) 10/24/2012    Past Surgical History  Procedure Laterality Date  . Cesarean section    . Pelvic laparoscopy  1990s    Social History   Social History  . Marital Status: Married    Spouse Name: N/A  . Number of Children: 2  . Years of Education: N/A   Occupational History  . 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 Smoking status: Never Smoker   . Smokeless tobacco: Never Used  . Alcohol Use: 0.0 oz/week    0 Standard drinks or equivalent per week     Comment: socially   . Drug Use: No  . Sexual Activity: Not on file   Other Topics Concern  . Not on file   Social History Narrative   Live w/ husband             Medication List       This list is accurate as of: 12/21/14  8:40 PM.  Always use your most recent med list.               ADVIL 200 MG Caps  Generic drug:  Ibuprofen  Take by mouth.     b complex vitamins tablet  Take 1 tablet by mouth daily.     drospirenone-ethinyl estradiol 3-0.03 MG tablet  Commonly known as:  YASMIN,ZARAH,SYEDA  Take 1 tablet by mouth daily.     Lysine 500 MG Caps  Take 1 capsule by mouth daily.     MAGNESIUM PO  Take by mouth.     VITAMIN D (CHOLECALCIFEROL) PO  Take 2,000 mg by mouth.           Objective:   Physical Exam BP 150/80 mmHg  Pulse 69  Temp(Src) 98.2 F (36.8 C)  Wt 162 lb  (73.483 kg)  SpO2 99% General: BP: 120/80   Well developed, well nourished . NAD.  HEENT:  Normocephalic . Face symmetric, atraumatic. TMs: Slightly bulge, not red. Canal normal. TMJ no TTP, no click. Nose is slightly congested, sinuses no TTP. Throat: Symmetric, not red. Lungs:  CTA B Normal respiratory effort, no intercostal retractions, no accessory muscle use. Heart: RRR,  no murmur.  No pretibial edema bilaterally  Skin: Not pale. Not jaundice Neurologic:  alert & oriented X3.  Speech normal, gait appropriate for age and unassisted Psych--  Cognition and judgment appear intact.  Cooperative with normal attention span and concentration.  Behavior appropriate. No anxious or depressed appearing.      Assessment & Plan:   A/p  L Otalgia  No infection that I can tell, pain  from congestion?. Recommend conservative treatment with Flonase, pseudoephedrine.(BP initially elevated, repeated it was normal)

## 2014-12-21 NOTE — Progress Notes (Signed)
Pre visit review using our clinic review tool, if applicable. No additional management support is needed unless otherwise documented below in the visit note. 

## 2015-01-15 ENCOUNTER — Ambulatory Visit (INDEPENDENT_AMBULATORY_CARE_PROVIDER_SITE_OTHER): Payer: PRIVATE HEALTH INSURANCE | Admitting: Internal Medicine

## 2015-01-15 ENCOUNTER — Encounter: Payer: Self-pay | Admitting: Internal Medicine

## 2015-01-15 VITALS — BP 112/72 | HR 63 | Temp 98.0°F | Ht 62.0 in | Wt 162.0 lb

## 2015-01-15 DIAGNOSIS — Z0000 Encounter for general adult medical examination without abnormal findings: Secondary | ICD-10-CM

## 2015-01-15 DIAGNOSIS — Z09 Encounter for follow-up examination after completed treatment for conditions other than malignant neoplasm: Secondary | ICD-10-CM

## 2015-01-15 DIAGNOSIS — Z114 Encounter for screening for human immunodeficiency virus [HIV]: Secondary | ICD-10-CM

## 2015-01-15 DIAGNOSIS — H6522 Chronic serous otitis media, left ear: Secondary | ICD-10-CM

## 2015-01-15 LAB — COMPREHENSIVE METABOLIC PANEL
ALT: 15 U/L (ref 6–29)
AST: 18 U/L (ref 10–35)
Albumin: 4 g/dL (ref 3.6–5.1)
Alkaline Phosphatase: 41 U/L (ref 33–115)
BUN: 11 mg/dL (ref 7–25)
CHLORIDE: 99 mmol/L (ref 98–110)
CO2: 26 mmol/L (ref 20–31)
CREATININE: 0.67 mg/dL (ref 0.50–1.10)
Calcium: 8.8 mg/dL (ref 8.6–10.2)
Glucose, Bld: 73 mg/dL (ref 65–99)
Potassium: 4 mmol/L (ref 3.5–5.3)
SODIUM: 134 mmol/L — AB (ref 135–146)
Total Bilirubin: 0.6 mg/dL (ref 0.2–1.2)
Total Protein: 6.7 g/dL (ref 6.1–8.1)

## 2015-01-15 LAB — LIPID PANEL
Cholesterol: 187 mg/dL (ref 125–200)
HDL: 78 mg/dL (ref >=46)
LDL CALC: 82 mg/dL (ref <130)
Total CHOL/HDL Ratio: 2.4 Ratio (ref <=5.0)
Triglycerides: 136 mg/dL (ref <150)
VLDL: 27 mg/dL (ref <30)

## 2015-01-15 NOTE — Progress Notes (Signed)
Subjective:    Patient ID: Charlotte Petersen, female    DOB: 04-18-1965, 49 y.o.   MRN: [id number]  DOS:  01/15/2015 Type of visit - description : CPX Interval history: No new concerns    Review of Systems Constitutional: No fever. No chills. No unexplained wt changes. No unusual sweats  HEENT: No dental problems, no ear discharge, no facial swelling, no voice changes. No eye discharge, no eye  redness , no  intolerance to light . Continue with mild tinnitus and a feeling of fluid at the left ear  Respiratory: No wheezing , no  difficulty breathing. No cough , no mucus production  Cardiovascular: No CP, no leg swelling , no  Palpitations  GI: no nausea, no vomiting, no diarrhea , no  abdominal pain.  No blood in the stools. No dysphagia, no odynophagia    Endocrine: No polyphagia, no polyuria , no polydipsia  GU: No dysuria, gross hematuria, difficulty urinating. No urinary urgency, no frequency.  Musculoskeletal: No joint swellings or unusual aches or pains  Skin: No change in the color of the skin, palor , no  Rash  Allergic, immunologic: No environmental allergies , no  food allergies  Neurological: No dizziness no  syncope. No headaches. No diplopia, no slurred, no slurred speech, no motor deficits, no facial  Numbness  Hematological: No enlarged lymph nodes, no easy bruising , no unusual bleedings  Psychiatry: No suicidal ideas, no hallucinations, no beavior problems, no confusion.  No unusual/severe anxiety, no depression   Past Medical History  Diagnosis Date  . Ankle fracture 2010    no surgery  . RLS (restless legs syndrome) 10/24/2012    Past Surgical History  Procedure Laterality Date  . Cesarean section    . Pelvic laparoscopy  1990s    Social History   Social History  . Marital Status: Married    Spouse Name: N/A  . Number of Children: 2  . Years of Education: N/A   Occupational History  . 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 Smoking status: Never Smoker   . Smokeless tobacco: Never Used  . Alcohol Use: 0.0 oz/week    0 Standard drinks or equivalent per week     Comment: socially   . Drug Use: No  . Sexual Activity: Not on file   Other Topics Concern  . Not on file   Social History Narrative   Live w/ husband    Daughter 23 lives at home    Son married lives in [place]          Family History  Problem Relation Age of Onset  . Coronary artery disease Paternal Grandmother     <I  . Throat cancer Paternal Grandfather   . Colon cancer Other 88    GF  . Lupus Maternal Aunt   . Hypertension Maternal Grandmother   . Heart murmur Father     F and B (MV repair)  . Diabetes Neg Hx   . Stroke Neg Hx   . Breast cancer Other     aunt   . Arthritis Mother        Medication List       This list is accurate as of: 01/15/15 11:59 PM.  Always use your most recent med list.               ADVIL 200 MG Caps  Generic drug:  Ibuprofen  Take  by mouth.     b complex vitamins tablet  Take 1 tablet by mouth daily.     drospirenone-ethinyl estradiol 3-0.03 MG tablet  Commonly known as:  YASMIN,ZARAH,SYEDA  Take 1 tablet by mouth daily.     Lysine 500 MG Caps  Take 1 capsule by mouth daily.     MAGNESIUM PO  Take by mouth.     VITAMIN D (CHOLECALCIFEROL) PO  Take 2,000 mg by mouth.           Objective:   Physical Exam BP 112/72 mmHg  Pulse 63  Temp(Src) 98 F (36.7 C) (Oral)  Ht 5\' 2"  (1.575 m)  Wt 162 lb (73.483 kg)  BMI 29.62 kg/m2  SpO2 98% General:   Well developed, well nourished . NAD.  HEENT:  Normocephalic . Face symmetric, atraumatic. TMs continue to be bulge, worse on the left, no red,+ fluid level on the left Lungs:  CTA B Normal respiratory effort, no intercostal retractions, no accessory muscle use. Heart: RRR,  no murmur.  no pretibial edema bilaterally  Abdomen:  Not distended, soft, non-tender. No rebound or rigidity.   Skin: Not  pale. Not jaundice Neurologic:  alert & oriented X3.  Speech normal, gait appropriate for age and unassisted Psych--  Cognition and judgment appear intact.  Cooperative with normal attention span and concentration.  Behavior appropriate. No anxious or depressed appearing.    Assessment & Plan:   Assessment>  RLS  2013, used to see Dr. Allena KatzPatel, gabapentin helped but was requiring increasing doses, patient decided to wean -off gabapentin. Failed to improve with tonic water. Ankle fracture 2010, no surgery Tinnitus , serous otitis referred to ENT 01-2015 Palpable Ao: 08-19-2007 (-) Vitamin D deficiency  PLAN: Labs 7 2016: Vitamin D 28 low  (repeated 09-2014-- 30.3 normal  ).TSH normal. CBC normal. Ferritin normal.  RLS: Currently taking no medications, see comments above Vit D def: See labs above, currently taking 2000 units daily. Tinnitus, serous otitis: Refer to ENT RTC one year

## 2015-01-15 NOTE — Assessment & Plan Note (Addendum)
Td  06-22-14 Flu shot 11/10/2014 GF had colon ca dx in his 9780s Female care per gynecology, still taking her control pills  Palpable aorta, ultrasound 7- 07-2007 negative Labs  : CMP, FLP, HIV Diet and exercise discussed

## 2015-01-15 NOTE — Progress Notes (Signed)
Pre visit review using our clinic review tool, if applicable. No additional management support is needed unless otherwise documented below in the visit note. 

## 2015-01-15 NOTE — Patient Instructions (Addendum)
Get your blood work before you leave   Over-the-counter vitamin D 2000 units daily   Next visit  for a  physical exam in one year     Please schedule an appointment at the front desk

## 2015-01-17 NOTE — Assessment & Plan Note (Signed)
Labs 7 2016: Vitamin D 28 low  (repeated 09-2014-- 30.3 normal  ).TSH normal. CBC normal. Ferritin normal.  RLS: Currently taking no medications, see comments above Vit D def: See labs above, currently taking 2000 units daily. Tinnitus, serous otitis: Refer to ENT RTC one year

## 2015-01-18 LAB — HIV ANTIBODY (ROUTINE TESTING W REFLEX): HIV: NONREACTIVE

## 2015-09-10 LAB — HM MAMMOGRAPHY

## 2015-09-10 LAB — HM PAP SMEAR

## 2015-10-07 ENCOUNTER — Encounter: Payer: Self-pay | Admitting: Internal Medicine

## 2015-10-08 ENCOUNTER — Ambulatory Visit (INDEPENDENT_AMBULATORY_CARE_PROVIDER_SITE_OTHER): Payer: PRIVATE HEALTH INSURANCE | Admitting: Internal Medicine

## 2015-10-08 ENCOUNTER — Encounter: Payer: Self-pay | Admitting: Internal Medicine

## 2015-10-08 VITALS — BP 110/68 | HR 76 | Temp 98.1°F | Resp 14 | Ht 62.0 in | Wt 158.5 lb

## 2015-10-08 DIAGNOSIS — M542 Cervicalgia: Secondary | ICD-10-CM | POA: Diagnosis not present

## 2015-10-08 MED ORDER — PREDNISONE 10 MG PO TABS: ORAL_TABLET | ORAL | 0 refills | Status: DC

## 2015-10-08 NOTE — Progress Notes (Signed)
Pre visit review using our clinic review tool, if applicable. No additional management support is needed unless otherwise documented below in the visit note. 

## 2015-10-08 NOTE — Progress Notes (Signed)
Subjective:    Patient ID: [place] Charlotte Petersen, female    DOB: 09/03/1965, 50 y.o.   MRN: [id number]  DOS:  10/08/2015 Type of visit - description : Acute visit Interval history: He was remodeling her bathroom, worked for 2 months, at some point he had a moderate left-sided neck pain which went away however for the last 4 weeks is having on and off tingling and the left hand, mostly palmar aspect at the radial side. Symptoms are almost steady but more noticeable when she turns her neck to the left. No actual fall or injury. Ibuprofen didn't help  Review of Systems No fever chills No rash No lower extremity symptoms or difficulty with her gait No bladder or bowel incontinence  Past Medical History:  Diagnosis Date  . Ankle fracture 2010   no surgery  . Arthralgia of left temporomandibular joint    Dr. Jenne PaneBates  . RLS (restless legs syndrome) 10/24/2012    Past Surgical History:  Procedure Laterality Date  . CESAREAN SECTION    . PELVIC LAPAROSCOPY  1990s    Social History   Social History  . Marital status: Married    Spouse name: N/A  . Number of children: 2  . Years of education: N/A   Occupational History  . 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 Smoking status: Never Smoker  . Smokeless tobacco: Never Used  . Alcohol use 0.0 oz/week     Comment: socially   . Drug use: No  . Sexual activity: Not on file   Other Topics Concern  . Not on file   Social History Narrative   Live w/ husband    Daughter 23 lives at home    [place]             Medication List       Accurate as of 10/08/15 11:59 PM. Always use your most recent med list.          ADVIL 200 MG Caps Generic drug:  Ibuprofen Take by mouth.   b complex vitamins tablet Take 1 tablet by mouth daily.   drospirenone-ethinyl estradiol 3-0.03 MG tablet Commonly known as:  YASMIN,ZARAH,SYEDA Take 1 tablet by mouth daily.   Lysine 500 MG  Caps Take 1 capsule by mouth daily.   MAGNESIUM PO Take by mouth.   MULTIVITAMIN GUMMIES ADULT PO Take 1 tablet by mouth daily.   predniSONE 10 MG tablet Commonly known as:  DELTASONE 5 tablets x 2 days, 4 tablets x 2 days, 3 tabs x 2 days, 2 tabs x 2 days, 1 tab x 2 days   VITAMIN D (CHOLECALCIFEROL) PO Take 2,000 mg by mouth.          Objective:   Physical Exam BP 110/68 (BP Location: Right Arm, Patient Position: Sitting, Cuff Size: Normal)   Pulse 76   Temp 98.1 F (36.7 C) (Oral)   Resp 14   Ht 5\' 2"  (1.575 m)   Wt 158 lb 8 oz (71.9 kg)   SpO2 98%   BMI 28.99 kg/m  General:   Well developed, well nourished . NAD.  Neck: No TTP at the cervical spine, range of motion is normal although when she turned her head to the left she expressed some discomfort. Skin: Not pale. Not jaundice Neurologic:  alert & oriented X3.  Speech normal, gait appropriate for age and unassisted. DTRs symmetric, motor symmetric Psych--  Cognition and judgment appear intact.  Cooperative with normal attention span and concentration.  Behavior appropriate. No anxious or depressed appearing.      Assessment & Plan:   Assessment>  RLS  2013, used to see Dr. Allena Katz, gabapentin helped but was requiring increasing doses, patient decided to wean -off gabapentin. Failed to improve with tonic water. Ankle fracture 2010, no surgery Tinnitus , serous otitis referred to ENT 01-2015 Palpable Ao: 08-19-2007 (-) Vitamin D deficiency  PLAN: Neck pain with radiculopathy features: Neck pain with radiculopathy, no myelopathy on clinical grounds. We talk about referral and a steroid treatment vs trial with steroids only. We agreed on prednisone, Tylenol. Prompt  call back if not improving .See AVS.

## 2015-10-08 NOTE — Patient Instructions (Addendum)
  Take the prednisone as prescribed for few days  Tylenol  500 mg OTC 2 tabs a day every 8 hours as needed for pain  If not back to normal in 2 weeks or if they symptoms resurface: Call promptly for a referral

## 2015-10-10 ENCOUNTER — Encounter: Payer: Self-pay | Admitting: Internal Medicine

## 2015-10-10 DIAGNOSIS — M5410 Radiculopathy, site unspecified: Secondary | ICD-10-CM

## 2015-10-10 DIAGNOSIS — M542 Cervicalgia: Secondary | ICD-10-CM

## 2015-10-10 NOTE — Assessment & Plan Note (Signed)
Neck pain with radiculopathy features: Neck pain with radiculopathy, no myelopathy on clinical grounds. We talk about referral and a steroid treatment vs trial with steroids only. We agreed on prednisone, Tylenol. Prompt  call back if not improving .See AVS.

## 2015-10-11 NOTE — Telephone Encounter (Signed)
Referral placed.

## 2015-10-14 ENCOUNTER — Other Ambulatory Visit: Payer: Self-pay | Admitting: Orthopaedic Surgery

## 2015-10-14 DIAGNOSIS — M4722 Other spondylosis with radiculopathy, cervical region: Secondary | ICD-10-CM

## 2015-10-15 ENCOUNTER — Ambulatory Visit
Admission: RE | Admit: 2015-10-15 | Discharge: 2015-10-15 | Disposition: A | Discharge status: Home / Self Care | Payer: PRIVATE HEALTH INSURANCE | Source: Ambulatory Visit | Attending: Orthopaedic Surgery | Admitting: Orthopaedic Surgery

## 2015-10-15 DIAGNOSIS — M4722 Other spondylosis with radiculopathy, cervical region: Secondary | ICD-10-CM

## 2015-11-09 HISTORY — PX: NECK SURGERY: SHX720

## 2015-12-27 ENCOUNTER — Other Ambulatory Visit: Payer: Self-pay | Admitting: *Deleted

## 2015-12-27 DIAGNOSIS — M5410 Radiculopathy, site unspecified: Secondary | ICD-10-CM

## 2015-12-28 ENCOUNTER — Ambulatory Visit (INDEPENDENT_AMBULATORY_CARE_PROVIDER_SITE_OTHER): Payer: PRIVATE HEALTH INSURANCE | Admitting: Neurology

## 2015-12-28 DIAGNOSIS — M5410 Radiculopathy, site unspecified: Secondary | ICD-10-CM | POA: Diagnosis not present

## 2015-12-28 DIAGNOSIS — M5412 Radiculopathy, cervical region: Secondary | ICD-10-CM

## 2015-12-28 NOTE — Procedures (Signed)
[facility]  [address], [address]  [place], [place] [address] Tel: [phone] Fax:  [phone] Test Date:  12/28/2015  Patient: Charlotte BigSharon Vanessen DOB: 07/01/1965 Physician: Nita Sickleonika Celestine Bougie, DO  Sex: Female Height: 5\' 3"  Ref Phys: Estill BambergMark Dumonski, M.D.  ID#: [id number] Temp: 33.0C Technician: Judie PetitM. Dean   Patient Complaints: This is a 50 year old female status post ACDF at C5-6 in September 2017 referred for evaluation of achy left shoulder and arm pain as well as intermittent numbness and tingling in 4th and 5th digits of left hand.    NCV & EMG Findings: Extensive electrodiagnostic testing of the left upper extremity shows:  1. Left median, ulnar, dorsal ulnar cutaneous, and mixed palmar sensory responses are within normal limits. 2. Left median and ulnar motor responses are within normal limits. 3. Sparse chronic motor axon loss changes are seen affecting the biceps and pronator teres muscles, without accompanied active denervation.  Impression: 1. Residuals of a chronic C6 radiculopathy affecting the left upper extremity; mild in degree electrically. 2. There is no evidence of carpal tunnel syndrome or ulnar neuropathy affecting the left upper extremity.   ___________________________ Nita Sickleonika Paymon Rosensteel, DO    Nerve Conduction Studies Anti Sensory Summary Table   Stim Site NR Peak (ms) Norm Peak (ms) P-T Amp (V) Norm P-T Amp  Left DorsCutan Anti Sensory (Dorsum 5th MC)  Wrist    2.3 <3.1 28.3 >12  Left Median Anti Sensory (2nd Digit)  Wrist    3.1 <3.4 22.2 >20  Left Ulnar Anti Sensory (5th Digit)  Wrist    2.5 <3.1 21.4 >12   Motor Summary Table   Stim Site NR Onset (ms) Norm Onset (ms) O-P Amp (mV) Norm O-P Amp Site1 Site2 Delta-0 (ms) Dist (cm) Vel (m/s) Norm Vel (m/s)  Left Median Motor (Abd Poll Brev)  Wrist    2.5 <3.9 8.5 >6 Elbow Wrist 3.7 21.0 57 >50  Elbow    6.2  8.0         Left Ulnar Motor (Abd Dig Minimi)  Wrist    2.3 <3.1 13.7 >7 B Elbow Wrist 2.8  18.0 64 >50  B Elbow    5.1  12.4  A Elbow B Elbow 1.4 10.0 71 >50  A Elbow    6.5  11.7         Left Ulnar (FDI) Motor (1st DI)  Wrist    3.6 <4.3 16.8 >7 B Elbow Wrist 2.7 17.0 63 >50  B Elbow    6.3  15.4  A Elbow B Elbow 1.4 10.0 71 >50  A Elbow    7.7  15.2          Comparison Summary Table   Stim Site NR Peak (ms) Norm Peak (ms) P-T Amp (V) Site1 Site2 Delta-P (ms) Norm Delta (ms)  Left Median/Ulnar Palm Comparison (Wrist - 8cm)  Median Palm    1.8 <2.2 27.7 Median Palm Ulnar Palm 0.1   Ulnar Palm    1.7 <2.2 10.5       EMG   Side Muscle Ins Act Fibs Psw Fasc Number Recrt Dur Dur. Amp Amp. Poly Poly. Comment  Left 1stDorInt Nml Nml Nml Nml Nml Nml Nml Nml Nml Nml Nml Nml N/A  Left Ext Indicis Nml Nml Nml Nml Nml Nml Nml Nml Nml Nml Nml Nml N/A  Left PronatorTeres Nml Nml Nml Nml 1- Mod-R Few 1+ Few 1+ Nml Nml N/A  Left Biceps Nml Nml Nml Nml 1- Mod-R Few  1+ Few 1+ Nml Nml N/A  Left Triceps Nml Nml Nml Nml Nml Nml Nml Nml Nml Nml Nml Nml N/A  Left Deltoid Nml Nml Nml Nml Nml Nml Nml Nml Nml Nml Nml Nml N/A      Waveforms:

## 2016-02-25 ENCOUNTER — Encounter: Payer: Self-pay | Admitting: Internal Medicine

## 2016-03-24 ENCOUNTER — Encounter: Payer: Self-pay | Admitting: Internal Medicine

## 2016-03-24 ENCOUNTER — Ambulatory Visit (INDEPENDENT_AMBULATORY_CARE_PROVIDER_SITE_OTHER): Payer: PRIVATE HEALTH INSURANCE | Admitting: Internal Medicine

## 2016-03-24 VITALS — BP 118/78 | HR 88 | Temp 98.6°F | Resp 14 | Ht 62.0 in | Wt 159.5 lb

## 2016-03-24 DIAGNOSIS — Z0000 Encounter for general adult medical examination without abnormal findings: Secondary | ICD-10-CM

## 2016-03-24 LAB — CBC WITH DIFFERENTIAL/PLATELET
Basophils Absolute: 0.1 10*3/uL (ref 0.0–0.1)
Basophils Relative: 0.7 % (ref 0.0–3.0)
EOS PCT: 0.7 % (ref 0.0–5.0)
Eosinophils Absolute: 0.1 10*3/uL (ref 0.0–0.7)
HCT: 39.4 % (ref 36.0–46.0)
Hemoglobin: 13.3 g/dL (ref 12.0–15.0)
LYMPHS ABS: 3.1 10*3/uL (ref 0.7–4.0)
Lymphocytes Relative: 28.6 % (ref 12.0–46.0)
MCHC: 33.7 g/dL (ref 30.0–36.0)
MCV: 91.5 fl (ref 78.0–100.0)
MONO ABS: 0.7 10*3/uL (ref 0.1–1.0)
MONOS PCT: 6.5 % (ref 3.0–12.0)
NEUTROS ABS: 6.9 10*3/uL (ref 1.4–7.7)
NEUTROS PCT: 63.5 % (ref 43.0–77.0)
PLATELETS: 340 10*3/uL (ref 150.0–400.0)
RBC: 4.31 Mil/uL (ref 3.87–5.11)
RDW: 12.4 % (ref 11.5–15.5)
WBC: 10.9 10*3/uL — ABNORMAL HIGH (ref 4.0–10.5)

## 2016-03-24 LAB — LIPID PANEL
CHOL/HDL RATIO: 3
CHOLESTEROL: 208 mg/dL — AB (ref 0–200)
HDL: 78 mg/dL (ref >39.00)
NonHDL: 129.98
TRIGLYCERIDES: 224 mg/dL — AB (ref 0.0–149.0)
VLDL: 44.8 mg/dL — ABNORMAL HIGH (ref 0.0–40.0)

## 2016-03-24 LAB — COMPREHENSIVE METABOLIC PANEL
ALBUMIN: 4.3 g/dL (ref 3.5–5.2)
ALT: 21 U/L (ref 0–35)
AST: 20 U/L (ref 0–37)
Alkaline Phosphatase: 51 U/L (ref 39–117)
BILIRUBIN TOTAL: 0.7 mg/dL (ref 0.2–1.2)
BUN: 16 mg/dL (ref 6–23)
CALCIUM: 9.1 mg/dL (ref 8.4–10.5)
CHLORIDE: 102 meq/L (ref 96–112)
CO2: 28 mEq/L (ref 19–32)
CREATININE: 0.77 mg/dL (ref 0.40–1.20)
GFR: 84.73 mL/min (ref >60.00)
Glucose, Bld: 88 mg/dL (ref 70–99)
Potassium: 4.1 mEq/L (ref 3.5–5.1)
Sodium: 138 mEq/L (ref 135–145)
Total Protein: 7.2 g/dL (ref 6.0–8.3)

## 2016-03-24 LAB — LDL CHOLESTEROL, DIRECT: Direct LDL: 105 mg/dL

## 2016-03-24 LAB — TSH: TSH: 1.81 u[IU]/mL (ref 0.35–4.50)

## 2016-03-24 NOTE — Progress Notes (Signed)
Subjective:    Patient ID: Charlotte PlanasSharon L Petersen, female    DOB: 06/08/1965, 51 y.o.   MRN: [id number]  DOS:  03/24/2016 Type of visit - description : cpx Interval history:  In general doing well  Review of Systems  Constitutional: No fever. No chills. No unexplained wt changes. No unusual sweats  HEENT: No dental problems, no ear discharge, no facial swelling, no voice changes. No eye discharge, no eye  redness , no  intolerance to light   Respiratory: No wheezing , no  difficulty breathing. No cough , no mucus production  Cardiovascular: No CP, no leg swelling , no  Palpitations  GI: no nausea, no vomiting, no diarrhea , no  abdominal pain.  No blood in the stools. No dysphagia, no odynophagia    Endocrine: No polyphagia, no polyuria , no polydipsia  GU: No dysuria, gross hematuria, difficulty urinating. No urinary urgency, no frequency.  Musculoskeletal: Status post neck surgery, doing well.  Skin: No change in the color of the skin, palor , no  Rash  Allergic, immunologic: No environmental allergies , no  food allergies  Neurological: No dizziness no  syncope. No headaches. No diplopia, no slurred, no slurred speech, no motor deficits, no facial  Numbness  Hematological: No enlarged lymph nodes, no easy bruising , no unusual bleedings  Psychiatry: No suicidal ideas, no hallucinations, no beavior problems, no confusion.  No unusual/severe anxiety, no depression   Past Medical History:  Diagnosis Date  . Ankle fracture 2010   no surgery  . Arthralgia of left temporomandibular joint    Dr. Jenne PaneBates  . 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 Radiculopathy, cervical    L sided  . RLS (restless legs syndrome) 10/24/2012    Past Surgical History:  Procedure Laterality Date  . CESAREAN SECTION    . NECK SURGERY Charlotte Petersen 11/09/2015   Dr Randell Patientumonsky--diskectomy - fussion  . PELVIC LAPAROSCOPY   1990s    Social History   Social History  . Marital status: Married    Spouse name: Charlotte Petersen  . Number of children: 2  . Years of education: Charlotte Petersen   Occupational History  . 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 Smoking status: Never Smoker  . Smokeless tobacco: Never Used  . Alcohol use 0.0 oz/week     Comment: socially   . Drug use: No  . Sexual activity: Not on file   Other Topics Concern  . Not on file   Social History Narrative   Live w/ husband    Daughter 23 lives at home , to get married 2019   Son married lives in [place]          Family History  Problem Relation Age of Onset  . Coronary artery disease Paternal Grandmother     <I  . Throat cancer Paternal Grandfather   . Lupus Maternal Aunt   . Hypertension Maternal Grandmother   . Heart murmur Father     F and B (MV repair)  . Breast cancer Other     aunt   . Arthritis Mother   . Colon cancer Maternal Grandfather [id number]  . Diabetes Neg Hx   . Stroke Neg Hx      Allergies as of 03/24/2016   No Known Allergies     Medication List  Accurate as of 03/24/16 11:59 PM. Always use your most recent med list.          ADVIL 200 MG Caps Generic drug:  Ibuprofen Take by mouth.   b complex vitamins tablet Take 1 tablet by mouth daily.   drospirenone-ethinyl estradiol 3-0.03 MG tablet Commonly known as:  YASMIN,ZARAH,SYEDA Take 1 tablet by mouth daily.   Lysine 500 MG Caps Take 1 capsule by mouth daily.   MAGNESIUM PO Take by mouth.   MULTIVITAMIN GUMMIES ADULT PO Take 1 tablet by mouth daily.   VITAMIN D (CHOLECALCIFEROL) PO Take 2,000 mg by mouth.          Objective:   Physical Exam BP 118/78 (BP Location: Left Arm, Patient Position: Sitting, Cuff Size: Small)   Pulse 88   Temp 98.6 F (37 C) (Oral)   Resp 14   Ht 5\' 2"  (1.575 m)   Wt 159 lb 8 oz (72.3 kg)   SpO2 98%   BMI 29.17 kg/m   General:   Well developed, well nourished . NAD.  Neck:  No  thyromegaly  HEENT:  Normocephalic . Face symmetric, atraumatic Lungs:  CTA B Normal respiratory effort, no intercostal retractions, no accessory muscle use. Heart: RRR,  no murmur.  No pretibial edema bilaterally  Abdomen:  Not distended, soft, non-tender. No rebound or rigidity.   Skin: Exposed areas without rash. Not pale. Not jaundice Neurologic:  alert & oriented X3.  Speech normal, gait appropriate for age and unassisted Strength symmetric and appropriate for age.  Psych: Cognition and judgment appear intact.  Cooperative with normal attention span and concentration.  Behavior appropriate. No anxious or depressed appearing.    Assessment & Plan:   Assessment>  RLS  2013, used to see Dr. Allena Katz, gabapentin helped but was requiring increasing doses, patient decided to wean -off gabapentin. Failed to improve with tonic water. Ankle fracture 2010, no surgery Tinnitus , serous otitis referred to ENT 01-2015 Palpable Ao: 08-19-2007 (-) Vitamin D deficiency Neck surgery 10-2015- residual minimal problems at L arm  PLAN: Neck pain: Since the last visit, neck pain continued, eventually requiring surgery, doing well, has minimal residual paresthesias left arm. Vitamin D deficiency: On OTC vitamin D 2000 units daily. Check labs. RTC one year

## 2016-03-24 NOTE — Patient Instructions (Signed)
GO TO THE LAB : Get the blood work     GO TO THE FRONT DESK Schedule your next appointment for a  Physical in 1 year  

## 2016-03-24 NOTE — Progress Notes (Signed)
Pre visit review using our clinic review tool, if applicable. No additional management support is needed unless otherwise documented below in the visit note. 

## 2016-03-24 NOTE — Assessment & Plan Note (Addendum)
Td  06-22-14; had a Flu shot  12-07-15 GF had colon ca dx in his 6780s Female care per gynecology, PAPs, MMGs,on BCP  Palpable aorta, ultrasound 7- 07-2007 negative Labs: CMP, FLP, vitamin D, CBC, TSH Diet and exercise discussed

## 2016-03-25 NOTE — Assessment & Plan Note (Signed)
Neck pain: Since the last visit, neck pain continued, eventually requiring surgery, doing well, has minimal residual paresthesias left arm. Vitamin D deficiency: On OTC vitamin D 2000 units daily. Check labs. RTC one year

## 2016-03-28 LAB — VITAMIN D 1,25 DIHYDROXY
VITAMIN D3 1, 25 (OH): 98 pg/mL
Vitamin D 1, 25 (OH)2 Total: 98 pg/mL — ABNORMAL HIGH (ref 18–72)
Vitamin D2 1, 25 (OH)2: <8 pg/mL

## 2016-11-24 LAB — HM MAMMOGRAPHY

## 2017-03-23 ENCOUNTER — Encounter: Payer: Self-pay | Admitting: Internal Medicine

## 2017-03-26 ENCOUNTER — Encounter: Payer: Self-pay | Admitting: Internal Medicine

## 2017-03-29 ENCOUNTER — Encounter: Payer: Self-pay | Admitting: Internal Medicine

## 2017-03-30 ENCOUNTER — Encounter: Payer: Self-pay | Admitting: Internal Medicine

## 2017-03-30 ENCOUNTER — Ambulatory Visit (INDEPENDENT_AMBULATORY_CARE_PROVIDER_SITE_OTHER): Payer: BLUE CROSS/BLUE SHIELD | Admitting: Internal Medicine

## 2017-03-30 ENCOUNTER — Encounter: Payer: PRIVATE HEALTH INSURANCE | Admitting: Internal Medicine

## 2017-03-30 VITALS — BP 122/74 | HR 73 | Temp 97.5°F | Resp 14 | Ht 62.0 in | Wt 168.5 lb

## 2017-03-30 DIAGNOSIS — Z0000 Encounter for general adult medical examination without abnormal findings: Secondary | ICD-10-CM | POA: Diagnosis not present

## 2017-03-30 NOTE — Progress Notes (Signed)
Subjective:    Patient ID: Charlotte Petersen, female    DOB: 02/02/1966, 52 y.o.   MRN: [id number]  DOS:  03/30/2017 Type of visit - description : cpx Interval history: Since the last office visit she is doing well; her daughter is getting married soon, she is busy but very happy.  Review of Systems  A 14 point review of systems is negative    Past Medical History:  Diagnosis Date  . Ankle fracture 2010   no surgery  . Arthralgia of left temporomandibular joint    Dr. Jenne PaneBates  . 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 Radiculopathy, cervical    L sided  . RLS (restless legs syndrome) 10/24/2012    Past Surgical History:  Procedure Laterality Date  . CESAREAN SECTION    . NECK SURGERY N/A 11/09/2015   Dr Randell Patientumonsky--diskectomy - fussion  . PELVIC LAPAROSCOPY  1990s    Social History   Socioeconomic History  . Marital status: Married    Spouse name: Not on file  . Number of children: 2  . Years of education: Not on file  . Highest education level: Not on file  Social Needs  . Financial resource strain: Not on file  . Food insecurity - worry: Not on file  . Food insecurity - inability: Not on file  . Transportation needs - medical: Not on file  . Transportation needs - non-medical: Not on file  Occupational History  . 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 Smoking status: Never Smoker  . Smokeless tobacco: Never Used  Substance and Sexual Activity  . Alcohol use: Yes    Alcohol/week: 0.0 oz    Comment: socially   . Drug use: No  . Sexual activity: Not on file  Other Topics Concern  . Not on file  Social History Narrative   Live w/ husband    Daughter [id number] to get married summer 2019, [place] live in [place]   Son married lives in [place]          Family History  Problem Relation Age of Onset  . Coronary artery disease Paternal Grandmother        <I  .  Throat cancer Paternal Grandfather   . Lupus Maternal Aunt   . Hypertension Maternal Grandmother   . Heart murmur Father        F and B (MV repair)  . Breast cancer Other        aunt , young age, 7540s  . Arthritis Mother   . Colon cancer Maternal Grandfather [id number]  . Diabetes Neg Hx   . Stroke Neg Hx      Allergies as of 03/30/2017   No Known Allergies     Medication List        Accurate as of 03/30/17 11:59 PM. Always use your most recent med list.          ADVIL 200 MG Caps Generic drug:  Ibuprofen Take by mouth.   b complex vitamins tablet Take 1 tablet by mouth daily.   cholecalciferol 1000 units tablet Commonly known as:  VITAMIN D Take 1,000 Units by mouth daily.   drospirenone-ethinyl estradiol 3-0.03 MG tablet Commonly known as:  YASMIN,ZARAH,SYEDA Take 1 tablet by mouth daily.   Lysine 500 MG Caps Take 1 capsule by mouth daily.   MAGNESIUM PO Take by  mouth.   MULTIVITAMIN GUMMIES ADULT PO Take 1 tablet by mouth daily.          Objective:   Physical Exam BP 122/74 (BP Location: Left Arm, Patient Position: Sitting, Cuff Size: Small)   Pulse 73   Temp (!) 97.5 F (36.4 C) (Oral)   Resp 14   Ht 5\' 2"  (1.575 m)   Wt 168 lb 8 oz (76.4 kg)   SpO2 96%   BMI 30.82 kg/m  General:   Well developed, well nourished . NAD.  Neck: No  thyromegaly  HEENT:  Normocephalic . Face symmetric, atraumatic Lungs:  CTA B Normal respiratory effort, no intercostal retractions, no accessory muscle use. Heart: RRR,  no murmur.  No pretibial edema bilaterally  Abdomen:  Not distended, soft, non-tender. No rebound or rigidity.  + Palpable aorta.  At baseline Skin: Exposed areas without rash. Not pale. Not jaundice Neurologic:  alert & oriented X3.  Speech normal, gait appropriate for age and unassisted Strength symmetric and appropriate for age.  Psych: Cognition and judgment appear intact.  Cooperative with normal attention span and concentration.  Behavior  appropriate. No anxious or depressed appearing.     Assessment & Plan:    Assessment>  RLS  2013, used to see Dr. Allena Katz, gabapentin helped but was requiring increasing doses, patient decided to wean -off gabapentin. Failed to improve with tonic water. Ankle fracture 2010, no surgery Tinnitus , serous otitis referred to ENT 01-2015 Palpable Ao: 08-19-2007 (-) Vitamin D deficiency Neck surgery 10-2015- residual minimal problems at L arm  PLAN: RLS:, Still having symptoms.  "Managing the best I can".  We talk about possibly check a ferritin level or try another medication but she is not ready. Vit D def: Last vitamin D was high, supplements decreased from 2000 mg to 1000 mg, recheck levels RTC 1 year

## 2017-03-30 NOTE — Assessment & Plan Note (Addendum)
Td  06-22-14; had a Flu shot at work -CCS: GF had colon ca dx in his 4480s, almost due for cscope , will call when ready -Female care per gynecology(PAPs, MMGs, BCP)  -Palpable aorta, ultrasound 7- 07-2007 negative -Labs-FLP, CBC, vitamin D - doing really well with diet, not exercising much due to lack of time.  Also noted that if she exercised too much, her RLS sxs are worse that night .

## 2017-03-30 NOTE — Patient Instructions (Signed)
GO TO THE LAB : Get the blood work     GO TO THE FRONT DESK Schedule your next appointment   for a physical exam in 1 year 

## 2017-03-30 NOTE — Progress Notes (Signed)
Pre visit review using our clinic review tool, if applicable. No additional management support is needed unless otherwise documented below in the visit note. 

## 2017-03-31 LAB — CBC WITH DIFFERENTIAL/PLATELET
Basophils Absolute: 83 cells/uL (ref 0–200)
Basophils Relative: 0.8 %
EOS ABS: 104 {cells}/uL (ref 15–500)
Eosinophils Relative: 1 %
HCT: 38.4 % (ref 35.0–45.0)
HEMOGLOBIN: 13.2 g/dL (ref 11.7–15.5)
LYMPHS ABS: 3203 {cells}/uL (ref 850–3900)
MCH: 30.6 pg (ref 27.0–33.0)
MCHC: 34.4 g/dL (ref 32.0–36.0)
MCV: 88.9 fL (ref 80.0–100.0)
MPV: 10.4 fL (ref 7.5–12.5)
Monocytes Relative: 6.8 %
NEUTROS ABS: 6302 {cells}/uL (ref 1500–7800)
Neutrophils Relative %: 60.6 %
Platelets: 270 10*3/uL (ref 140–400)
RBC: 4.32 10*6/uL (ref 3.80–5.10)
RDW: 11.8 % (ref 11.0–15.0)
Total Lymphocyte: 30.8 %
WBC: 10.4 10*3/uL (ref 3.8–10.8)
WBCMIX: 707 {cells}/uL (ref 200–950)

## 2017-03-31 LAB — BASIC METABOLIC PANEL
BUN: 16 mg/dL (ref 7–25)
CHLORIDE: 102 mmol/L (ref 98–110)
CO2: 24 mmol/L (ref 20–32)
CREATININE: 0.86 mg/dL (ref 0.50–1.10)
Calcium: 9.1 mg/dL (ref 8.6–10.2)
Glucose, Bld: 74 mg/dL (ref 65–99)
Potassium: 4 mmol/L (ref 3.5–5.3)
Sodium: 139 mmol/L (ref 135–146)

## 2017-03-31 LAB — LIPID PANEL
CHOL/HDL RATIO: 2.2 (calc) (ref <5.0)
Cholesterol: 190 mg/dL (ref <200)
HDL: 85 mg/dL (ref >50)
LDL Cholesterol (Calc): 83 mg/dL (calc)
Non-HDL Cholesterol (Calc): 105 mg/dL (calc) (ref <130)
Triglycerides: 122 mg/dL (ref <150)

## 2017-03-31 LAB — VITAMIN D 25 HYDROXY (VIT D DEFICIENCY, FRACTURES): Vit D, 25-Hydroxy: 40 ng/mL (ref 30–100)

## 2017-03-31 NOTE — Assessment & Plan Note (Signed)
RLS:, Still having symptoms.  "Managing the best I can".  We talk about possibly check a ferritin level or try another medication but she is not ready. Vit D def: Last vitamin D was high, supplements decreased from 2000 mg to 1000 mg, recheck levels RTC 1 year

## 2017-04-23 ENCOUNTER — Encounter: Payer: Self-pay | Admitting: Internal Medicine

## 2017-04-23 NOTE — Telephone Encounter (Signed)
Hey [place], Can you assist the the lab billing? Thanks

## 2017-07-02 ENCOUNTER — Encounter: Payer: Self-pay | Admitting: Gastroenterology

## 2017-07-02 ENCOUNTER — Encounter: Payer: Self-pay | Admitting: Internal Medicine

## 2017-07-02 DIAGNOSIS — Z1211 Encounter for screening for malignant neoplasm of colon: Secondary | ICD-10-CM

## 2017-07-27 ENCOUNTER — Other Ambulatory Visit: Payer: Self-pay

## 2017-07-27 ENCOUNTER — Ambulatory Visit (AMBULATORY_SURGERY_CENTER): Payer: Self-pay | Admitting: *Deleted

## 2017-07-27 VITALS — Ht 63.0 in | Wt 176.0 lb

## 2017-07-27 DIAGNOSIS — Z1211 Encounter for screening for malignant neoplasm of colon: Secondary | ICD-10-CM

## 2017-07-27 MED ORDER — NA SULFATE-K SULFATE-MG SULF 17.5-3.13-1.6 GM/177ML PO SOLN: ORAL | 0 refills | Status: DC

## 2017-07-27 NOTE — Progress Notes (Signed)
Patient denies any allergies to eggs or soy. Patient denies any problems with anesthesia/sedation. Pt hard to wake-up after surgery and BP drops. Patient denies any oxygen use at home. Patient denies taking any diet/weight loss medications or blood thinners. EMMI education assisgned to patient on colonoscopy, this was explained and instructions given to patient. Pt requested to do the Suprep. She states her husband did that,he's a pt of Dr.Jacobs. Suprep $15 coupon given to pt.

## 2017-08-10 ENCOUNTER — Ambulatory Visit (AMBULATORY_SURGERY_CENTER): Payer: BLUE CROSS/BLUE SHIELD | Admitting: Gastroenterology

## 2017-08-10 ENCOUNTER — Other Ambulatory Visit: Payer: Self-pay

## 2017-08-10 ENCOUNTER — Encounter: Payer: Self-pay | Admitting: Gastroenterology

## 2017-08-10 VITALS — BP 128/70 | HR 54 | Temp 99.3°F | Resp 13 | Ht 63.0 in | Wt 176.0 lb

## 2017-08-10 DIAGNOSIS — Z1211 Encounter for screening for malignant neoplasm of colon: Secondary | ICD-10-CM

## 2017-08-10 DIAGNOSIS — Z1212 Encounter for screening for malignant neoplasm of rectum: Secondary | ICD-10-CM

## 2017-08-10 MED ORDER — SODIUM CHLORIDE 0.9 % IV SOLN: 500.0000 mL | Freq: Once | INTRAVENOUS | Status: DC

## 2017-08-10 NOTE — Patient Instructions (Signed)
YOU HAD AN ENDOSCOPIC PROCEDURE TODAY AT THE [facility]:   Refer to the procedure report that was given to you for any specific questions about what was found during the examination.  If the procedure report does not answer your questions, please call your gastroenterologist to clarify.  If you requested that your care partner not be given the details of your procedure findings, then the procedure report has been included in a sealed envelope for you to review at your convenience later.  YOU SHOULD EXPECT: Some feelings of bloating in the abdomen. Passage of more gas than usual.  Walking can help get rid of the air that was put into your GI tract during the procedure and reduce the bloating. If you had a lower endoscopy (such as a colonoscopy or flexible sigmoidoscopy) you may notice spotting of blood in your stool or on the toilet paper. If you underwent a bowel prep for your procedure, you may not have a normal bowel movement for a few days.  Please Note:  You might notice some irritation and congestion in your nose or some drainage.  This is from the oxygen used during your procedure.  There is no need for concern and it should clear up in a day or so.  SYMPTOMS TO REPORT IMMEDIATELY:   Following lower endoscopy (colonoscopy or flexible sigmoidoscopy):  Excessive amounts of blood in the stool  Significant tenderness or worsening of abdominal pains  Swelling of the abdomen that is new, acute  Fever of 100F or higher  For urgent or emergent issues, a gastroenterologist can be reached at any hour by calling [phone].   DIET:  We do recommend a small meal at first, but then you may proceed to your regular diet.  Drink plenty of fluids but you should avoid alcoholic beverages for 24 hours.  ACTIVITY:  You should plan to take it easy for the rest of today and you should NOT DRIVE or use heavy machinery until tomorrow (because of the sedation medicines used during the test).     FOLLOW UP: Our staff will call the number listed on your records the next business day following your procedure to check on you and address any questions or concerns that you may have regarding the information given to you following your procedure. If we do not reach you, we will leave a message.  However, if you are feeling well and you are not experiencing any problems, there is no need to return our call.  We will assume that you have returned to your regular daily activities without incident.  If any biopsies were taken you will be contacted by phone or by letter within the next 1-3 weeks.  Please call us at [phone] if you have not heard about the biopsies in 3 weeks.    SIGNATURES/CONFIDENTIALITY: You and/or your care partner have signed paperwork which will be entered into your electronic medical record.  These signatures attest to the fact that that the information above on your After Visit Summary has been reviewed and is understood.  Full responsibility of the confidentiality of this discharge information lies with you and/or your care-partner. 

## 2017-08-10 NOTE — Progress Notes (Signed)
To PACU, VSS. Report to [email or web address] 

## 2017-08-10 NOTE — Op Note (Signed)
[facility] Patient Name: Charlotte Petersen Procedure Date: 08/10/2017 2:27 PM MRN: [id number] Endoscopist: Rachael Fee , MD Age: 52 Referring MD:  Date of Birth: 1965/10/11 Gender: Female Account #: [id number] Procedure:                Colonoscopy Indications:              Screening for colorectal malignant neoplasm Medicines:                Monitored Anesthesia Care Procedure:                Pre-Anesthesia Assessment:                           - Prior to the procedure, a History and Physical                            was performed, and patient medications and                            allergies were reviewed. The patient's tolerance of                            previous anesthesia was also reviewed. The risks                            and benefits of the procedure and the sedation                            options and risks were discussed with the patient.                            All questions were answered, and informed consent                            was obtained. Prior Anticoagulants: The patient has                            taken no previous anticoagulant or antiplatelet                            agents. ASA Grade Assessment: II - A patient with                            mild systemic disease. After reviewing the risks                            and benefits, the patient was deemed in                            satisfactory condition to undergo the procedure.                           After obtaining informed consent, the colonoscope  was passed under direct vision. Throughout the                            procedure, the patient's blood pressure, pulse, and                            oxygen saturations were monitored continuously. The                            Colonoscope was introduced through the anus and                            advanced to the the cecum, identified by                            appendiceal orifice and  ileocecal valve. The                            colonoscopy was performed without difficulty. The                            patient tolerated the procedure well. The quality                            of the bowel preparation was good. The ileocecal                            valve, appendiceal orifice, and rectum were                            photographed. Scope In: 2:32:25 PM Scope Out: 2:43:52 PM Scope Withdrawal Time: 0 hours 8 minutes 45 seconds  Total Procedure Duration: 0 hours 11 minutes 27 seconds  Findings:                 The entire examined colon appeared normal on direct                            and retroflexion views. Complications:            No immediate complications. Estimated blood loss:                            None. Estimated Blood Loss:     Estimated blood loss: none. Impression:               - The entire examined colon is normal on direct and                            retroflexion views.                           - No polyps or cancers. Recommendation:           - Patient has a contact number available for  emergencies. The signs and symptoms of potential                            delayed complications were discussed with the                            patient. Return to normal activities tomorrow.                            Written discharge instructions were provided to the                            patient.                           - Resume previous diet.                           - Continue present medications.                           - Repeat colonoscopy in 10 years for screening. Rachael Feeaniel P Jacobs, MD 08/10/2017 2:45:17 PM This report has been signed electronically.

## 2017-08-13 ENCOUNTER — Telehealth: Payer: Self-pay | Admitting: *Deleted

## 2017-08-13 NOTE — Telephone Encounter (Signed)
No answer for post procedure call back. Left message and will attempt to call back later this afternoon. SM 

## 2017-08-13 NOTE — Telephone Encounter (Signed)
No answer for second post procedure call back Left message for patient to call with questions or concerns. Sm 

## 2017-11-12 ENCOUNTER — Ambulatory Visit (INDEPENDENT_AMBULATORY_CARE_PROVIDER_SITE_OTHER): Payer: BLUE CROSS/BLUE SHIELD

## 2017-11-12 DIAGNOSIS — Z23 Encounter for immunization: Secondary | ICD-10-CM | POA: Diagnosis not present

## 2017-11-13 DIAGNOSIS — Z23 Encounter for immunization: Secondary | ICD-10-CM | POA: Diagnosis not present

## 2017-11-30 LAB — HM MAMMOGRAPHY

## 2017-12-14 ENCOUNTER — Encounter: Payer: Self-pay | Admitting: Internal Medicine

## 2018-04-05 ENCOUNTER — Ambulatory Visit (INDEPENDENT_AMBULATORY_CARE_PROVIDER_SITE_OTHER): Payer: BLUE CROSS/BLUE SHIELD | Admitting: Internal Medicine

## 2018-04-05 ENCOUNTER — Encounter: Payer: Self-pay | Admitting: Internal Medicine

## 2018-04-05 VITALS — BP 116/74 | HR 74 | Temp 97.9°F | Resp 16 | Ht 63.0 in | Wt 170.1 lb

## 2018-04-05 DIAGNOSIS — G2581 Restless legs syndrome: Secondary | ICD-10-CM

## 2018-04-05 DIAGNOSIS — Z0000 Encounter for general adult medical examination without abnormal findings: Secondary | ICD-10-CM | POA: Diagnosis not present

## 2018-04-05 NOTE — Progress Notes (Signed)
Subjective:    Patient ID: Charlotte Petersen, female    DOB: [birth year], 53 y.o.   MRN: [id number]  DOS:  04/05/2018 Type of visit - description: CPX In general feels well  Review of Systems Increasing stress at work, managing well, she decided to exercise more and is feeling better. RLS symptoms have increased lately.  No new neurological symptoms other than RLS  Other than above, a 14 point review of systems is negative   Past Medical History:  Diagnosis Date  . Ankle fracture 2010   no surgery  . Arthralgia of left temporomandibular joint    Dr. Jenne Pane  . 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 Radiculopathy, cervical    L sided  . RLS (restless legs syndrome) 10/24/2012   all day long per pt.    Past Surgical History:  Procedure Laterality Date  . CESAREAN SECTION    . NECK SURGERY N/A 11/09/2015   Dr Randell Patient - fussion  . PELVIC LAPAROSCOPY  1990s    Social History   Socioeconomic History  . Marital status: Married    Spouse name: Not on file  . Number of children: 2  . Years of education: Not on file  . Highest education level: Not on file  Occupational History  . 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 Financial resource strain: Not on file  . Food insecurity:    Worry: Not on file    Inability: Not on file  . Transportation needs:    Medical: Not on file    Non-medical: Not on file  Tobacco Use  . Smoking status: Never Smoker  . Smokeless tobacco: Never Used  Substance and Sexual Activity  . Alcohol use: Yes    Alcohol/week: 7.0 standard drinks    Types: 7 Glasses of wine per week  . Drug use: No  . Sexual activity: Not on file  Lifestyle  . Physical activity:    Days per week: Not on file    Minutes per session: Not on file  . Stress: Not on file  Relationships  . Social connections:    Talks on phone: Not on file    Gets together: Not on file    Attends religious service: Not on file    Active member of club or organization: Not on file    Attends meetings of clubs or organizations: Not on file    Relationship status: Not on file  . Intimate partner violence:    Fear of current or ex partner: Not on file    Emotionally abused: Not on file    Physically abused: Not on file    Forced sexual activity: Not on file  Other Topics Concern  . Not on file  Social History Narrative   Live w/ husband    Daughtergot married summer 2019,  [place]   Son married lives in [place]          Family History  Problem Relation Age of Onset  . Coronary artery disease Paternal Grandmother        <I  . Throat cancer Paternal Grandfather   . Lupus Maternal Aunt   . Hypertension Maternal Grandmother   . Heart murmur Father        F and B (MV repair)  . Breast cancer Other        aunt ,  young age, 47s  . Arthritis Mother   . CAD Mother        30  . Colon cancer Maternal Grandfather 37  . Diabetes Neg Hx   . Stroke Neg Hx   . Esophageal cancer Neg Hx   . Stomach cancer Neg Hx      Allergies as of 04/05/2018   No Known Allergies     Medication List       Accurate as of April 05, 2018 11:59 PM. Always use your most recent med list.        ADVIL 200 MG Caps Generic drug:  Ibuprofen Take 1 capsule by mouth as needed.   b complex vitamins tablet Take 1 tablet by mouth daily.   cholecalciferol 1000 units tablet Commonly known as:  VITAMIN D Take 1,000 Units by mouth daily.   COCONUT OIL PO Take 1 Dose by mouth daily.   Lysine 500 MG Caps Take 1 capsule by mouth daily.   MULTIVITAMIN GUMMIES ADULT PO Take 2 tablets by mouth daily.   OMEGA-3 FISH OIL PO Take 1 tablet by mouth daily.           Objective:   Physical Exam BP 116/74 (BP Location: Left Arm, Patient Position: Sitting, Cuff Size: Small)   Pulse 74   Temp 97.9 F (36.6 C) (Oral)   Resp 16   Ht 5\' 3"  (1.6 m)   Wt 170 lb 2  oz (77.2 kg)   SpO2 96%   BMI 30.14 kg/m   General: Well developed, NAD, BMI noted Neck: No  thyromegaly  HEENT:  Normocephalic . Face symmetric, atraumatic Lungs:  CTA B Normal respiratory effort, no intercostal retractions, no accessory muscle use. Heart: RRR,  no murmur.  No pretibial edema bilaterally  Abdomen:  Not distended, soft, non-tender. No rebound or rigidity.   Skin: Exposed areas without rash. Not pale. Not jaundice Neurologic:  alert & oriented X3.  Speech normal, gait appropriate for age and unassisted Strength symmetric and appropriate for age.  Psych: Cognition and judgment appear intact.  Cooperative with normal attention span and concentration.  Behavior appropriate. No anxious or depressed appearing.     Assessment    Assessment RLS  2013, used to see Dr. Allena Katz, gabapentin helped but was requiring increasing doses, patient decided to wean -off gabapentin. Failed to improve with tonic water. Ankle fracture 2010, no surgery Tinnitus , serous otitis referred to ENT 01-2015 Palpable Ao: 08-19-2007 (-) Vitamin D deficiency Neck surgery 10-2015- residual minimal problems at L arm Birth control: Husband's vasectomy  PLAN: RLS: Symptoms are slightly increased in the last few months, we discussed possible further eval, patient prefers to wait for now.  She would like  a different neurology group if referral is needed Vitamin D deficiency: Last level very good, continue supplements. RTC 1 year.

## 2018-04-05 NOTE — Progress Notes (Signed)
Pre visit review using our clinic review tool, if applicable. No additional management support is needed unless otherwise documented below in the visit note. 

## 2018-04-05 NOTE — Assessment & Plan Note (Signed)
Td  06-22-14; had a Flu shot at work -CCS: cscope 07/2017 Dr Christella Hartigan , 10 years per report -Female care per gynecology (PAPs, MMGs )  - Dexa not indicated  -Palpable aorta, ultrasound 7- 07-2007 negative -Labs: Reviewed, needs a CMP and FLP. -EKG: nsr - doing really well with diet, exercising more

## 2018-04-05 NOTE — Patient Instructions (Signed)
GO TO THE LAB : Get the blood work     GO TO THE FRONT DESK Schedule your next appointment   for a physical exam in 1 year 

## 2018-04-06 LAB — LIPID PANEL
Cholesterol: 203 mg/dL — ABNORMAL HIGH (ref <200)
HDL: 74 mg/dL (ref >=50)
LDL Cholesterol (Calc): 111 mg/dL — ABNORMAL HIGH
Non-HDL Cholesterol (Calc): 129 mg/dL (ref <130)
Total CHOL/HDL Ratio: 2.7 (calc) (ref <5.0)
Triglycerides: 88 mg/dL (ref <150)

## 2018-04-06 LAB — COMPREHENSIVE METABOLIC PANEL WITH GFR
AG Ratio: 1.6 (calc) (ref 1.0–2.5)
ALT: 19 U/L (ref 6–29)
AST: 19 U/L (ref 10–35)
Albumin: 4.2 g/dL (ref 3.6–5.1)
Alkaline phosphatase (APISO): 68 U/L (ref 37–153)
BUN: 16 mg/dL (ref 7–25)
CO2: 25 mmol/L (ref 20–32)
Calcium: 9.1 mg/dL (ref 8.6–10.4)
Chloride: 104 mmol/L (ref 98–110)
Creat: 0.78 mg/dL (ref 0.50–1.05)
Globulin: 2.7 g/dL (ref 1.9–3.7)
Glucose, Bld: 82 mg/dL (ref 65–99)
Potassium: 3.9 mmol/L (ref 3.5–5.3)
Sodium: 139 mmol/L (ref 135–146)
Total Bilirubin: 0.6 mg/dL (ref 0.2–1.2)
Total Protein: 6.9 g/dL (ref 6.1–8.1)

## 2018-04-07 NOTE — Assessment & Plan Note (Signed)
RLS: Symptoms are slightly increased in the last few months, we discussed possible further eval, patient prefers to wait for now.  She would like  a different neurology group if referral is needed Vitamin D deficiency: Last level very good, continue supplements. RTC 1 year.

## 2018-05-29 ENCOUNTER — Telehealth: Payer: Self-pay

## 2018-05-29 ENCOUNTER — Emergency Department (HOSPITAL_BASED_OUTPATIENT_CLINIC_OR_DEPARTMENT_OTHER)
Admission: EM | Admit: 2018-05-29 | Discharge: 2018-05-29 | Disposition: A | Discharge status: Home / Self Care | Payer: BLUE CROSS/BLUE SHIELD | Attending: Emergency Medicine | Admitting: Emergency Medicine

## 2018-05-29 ENCOUNTER — Encounter (HOSPITAL_BASED_OUTPATIENT_CLINIC_OR_DEPARTMENT_OTHER): Payer: Self-pay

## 2018-05-29 ENCOUNTER — Other Ambulatory Visit: Payer: Self-pay

## 2018-05-29 ENCOUNTER — Emergency Department (HOSPITAL_BASED_OUTPATIENT_CLINIC_OR_DEPARTMENT_OTHER): Payer: BLUE CROSS/BLUE SHIELD

## 2018-05-29 DIAGNOSIS — M25552 Pain in left hip: Secondary | ICD-10-CM | POA: Diagnosis present

## 2018-05-29 DIAGNOSIS — M5432 Sciatica, left side: Secondary | ICD-10-CM | POA: Insufficient documentation

## 2018-05-29 DIAGNOSIS — Z79899 Other long term (current) drug therapy: Secondary | ICD-10-CM | POA: Diagnosis not present

## 2018-05-29 LAB — CBC WITH DIFFERENTIAL/PLATELET
Abs Immature Granulocytes: 0.01 10*3/uL (ref 0.00–0.07)
Basophils Absolute: 0.1 10*3/uL (ref 0.0–0.1)
Basophils Relative: 1 %
Eosinophils Absolute: 0.2 10*3/uL (ref 0.0–0.5)
Eosinophils Relative: 4 %
HCT: 42.1 % (ref 36.0–46.0)
Hemoglobin: 14 g/dL (ref 12.0–15.0)
Immature Granulocytes: 0 %
Lymphocytes Relative: 46 %
Lymphs Abs: 2.9 10*3/uL (ref 0.7–4.0)
MCH: 30.7 pg (ref 26.0–34.0)
MCHC: 33.3 g/dL (ref 30.0–36.0)
MCV: 92.3 fL (ref 80.0–100.0)
Monocytes Absolute: 0.6 10*3/uL (ref 0.1–1.0)
Monocytes Relative: 10 %
Neutro Abs: 2.5 10*3/uL (ref 1.7–7.7)
Neutrophils Relative %: 39 %
Platelets: 255 10*3/uL (ref 150–400)
RBC: 4.56 MIL/uL (ref 3.87–5.11)
RDW: 11.8 % (ref 11.5–15.5)
WBC: 6.4 10*3/uL (ref 4.0–10.5)
nRBC: 0 % (ref 0.0–0.2)

## 2018-05-29 LAB — COMPREHENSIVE METABOLIC PANEL
ALT: 29 U/L (ref 0–44)
AST: 28 U/L (ref 15–41)
Albumin: 4.2 g/dL (ref 3.5–5.0)
Alkaline Phosphatase: 74 U/L (ref 38–126)
Anion gap: 8 (ref 5–15)
BUN: 17 mg/dL (ref 6–20)
CO2: 24 mmol/L (ref 22–32)
Calcium: 9 mg/dL (ref 8.9–10.3)
Chloride: 104 mmol/L (ref 98–111)
Creatinine, Ser: 0.82 mg/dL (ref 0.44–1.00)
GFR calc Af Amer: >60 mL/min (ref >60)
GFR calc non Af Amer: >60 mL/min (ref >60)
Glucose, Bld: 113 mg/dL — ABNORMAL HIGH (ref 70–99)
Potassium: 4.3 mmol/L (ref 3.5–5.1)
Sodium: 136 mmol/L (ref 135–145)
Total Bilirubin: 0.6 mg/dL (ref 0.3–1.2)
Total Protein: 7.6 g/dL (ref 6.5–8.1)

## 2018-05-29 MED ORDER — MORPHINE SULFATE (PF) 4 MG/ML IV SOLN
4.0000 mg | Freq: Once | INTRAVENOUS | Status: AC
  Administered 2018-05-29: 13:00:00 4 mg via INTRAVENOUS
  Filled 2018-05-29: qty 1

## 2018-05-29 MED ORDER — ONDANSETRON HCL 4 MG/2ML IJ SOLN
4.0000 mg | Freq: Once | INTRAMUSCULAR | Status: AC
  Administered 2018-05-29: 4 mg via INTRAVENOUS
  Filled 2018-05-29: qty 2

## 2018-05-29 MED ORDER — SODIUM CHLORIDE 0.9 % IV BOLUS
1000.0000 mL | Freq: Once | INTRAVENOUS | Status: AC
  Administered 2018-05-29: 1000 mL via INTRAVENOUS

## 2018-05-29 MED ORDER — KETOROLAC TROMETHAMINE 30 MG/ML IJ SOLN
30.0000 mg | Freq: Once | INTRAMUSCULAR | Status: AC
  Administered 2018-05-29: 30 mg via INTRAVENOUS
  Filled 2018-05-29: qty 1

## 2018-05-29 MED ORDER — PREDNISONE 20 MG PO TABS: ORAL_TABLET | ORAL | 0 refills | Status: DC

## 2018-05-29 MED ORDER — HYDROMORPHONE HCL 1 MG/ML IJ SOLN
1.0000 mg | Freq: Once | INTRAMUSCULAR | Status: AC
  Administered 2018-05-29: 1 mg via INTRAVENOUS
  Filled 2018-05-29: qty 1

## 2018-05-29 MED ORDER — DIAZEPAM 5 MG/ML IJ SOLN
2.5000 mg | Freq: Once | INTRAMUSCULAR | Status: AC
  Administered 2018-05-29: 2.5 mg via INTRAVENOUS
  Filled 2018-05-29: qty 2

## 2018-05-29 MED ORDER — HYDROCODONE-ACETAMINOPHEN 5-325 MG PO TABS: 1.0000 | ORAL_TABLET | Freq: Four times a day (QID) | ORAL | 0 refills | Status: DC | PRN

## 2018-05-29 MED ORDER — DIPHENHYDRAMINE HCL 50 MG/ML IJ SOLN
12.5000 mg | Freq: Once | INTRAMUSCULAR | Status: AC
  Administered 2018-05-29: 12.5 mg via INTRAVENOUS
  Filled 2018-05-29: qty 1

## 2018-05-29 MED ORDER — METHYLPREDNISOLONE SODIUM SUCC 125 MG IJ SOLR
125.0000 mg | Freq: Once | INTRAMUSCULAR | Status: AC
  Administered 2018-05-29: 125 mg via INTRAVENOUS
  Filled 2018-05-29: qty 2

## 2018-05-29 NOTE — Discharge Instructions (Addendum)
Take motrin for pain.   Take prednisone as prescribed.   Take vicodin for severe pain. Do NOT drive with it.   See neurosurgery for follow up for possible steroid injections   Return to ER if you have worse back pain, leg pain, numbness, weakness

## 2018-05-29 NOTE — ED Triage Notes (Signed)
C/o lower back pain and left hip pain started 4/9-denies injury-to triage in w/c

## 2018-05-29 NOTE — ED Provider Notes (Signed)
[facility] EMERGENCY DEPARTMENT Provider Note   CSN: [id number] Arrival date & time: 05/29/18  1201    History   Chief Complaint Chief Complaint  Patient presents with   Back Pain    HPI Charlotte Petersen is a 53 y.o. female history of restless leg syndrome, previous radiculopathy here presenting with left hip pain, back pain.  Patient states that for the last 4 to 5 days, she has been having shooting pain from her back down her hips and into her leg.  She states that they are sharp and similar to her previous sciatica symptoms.  Patient denies any trauma or injury.  Denies any trouble urinating.  Patient states that she was on gabapentin previously but has side effects from it so she is no longer taking it.  She states that she is not on any pain medicine currently.     The history is provided by the patient.    Past Medical History:  Diagnosis Date   Ankle fracture 2010   no surgery   Arthralgia of left temporomandibular joint    Dr. 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    Patient Active Problem List   Diagnosis Date Noted   PCP NOTES >>>>> 12/21/2014   RLS (restless legs syndrome) 10/24/2012   Annual physical exam 06/13/2010    Past Surgical History:  Procedure Laterality Date   CESAREAN SECTION     NECK SURGERY N/A 11/09/2015   Dr Randell Patientumonsky--diskectomy - fussion   PELVIC LAPAROSCOPY  1990s     OB History    Gravida  2   Para      Term      Preterm      AB      Living        SAB      TAB      Ectopic      Multiple      Live Births               Home Medications    Prior to Admission medications   Medication Sig Start Date End Date Taking? Authorizing Provider  b complex vitamins tablet Take 1 tablet by mouth daily.       [provider]  cholecalciferol (VITAMIN D) 1000 units tablet Take 1,000 Units by mouth daily.    [provider]  COCONUT OIL PO Take 1 Dose by mouth daily.    [provider]  Ibuprofen (ADVIL) 200 MG CAPS Take 1 capsule by mouth as needed.     [provider]  Lysine 500 MG CAPS Take 1 capsule by mouth daily.     [provider]  Multiple Vitamins-Minerals (MULTIVITAMIN GUMMIES ADULT PO) Take 2 tablets by mouth daily.     [provider]  Omega-3 Fatty Acids (OMEGA-3 FISH OIL PO) Take 1 tablet by mouth daily.    [provider]    Family History Family History  Problem Relation Age of Onset   Coronary artery disease Paternal Grandmother        <I   Throat cancer Paternal Grandfather    Lupus Maternal Aunt    Hypertension Maternal Grandmother    Heart murmur Father  F and B (MV repair)   Breast cancer Other        aunt , young age, 23s   Arthritis Mother    CAD Mother        37   Colon cancer Maternal Grandfather 71   Diabetes Neg Hx    Stroke Neg Hx    Esophageal cancer Neg Hx    Stomach cancer Neg Hx     Social History Social History   Tobacco Use   Smoking status: Never Smoker   Smokeless tobacco: Never Used  Substance Use Topics   Alcohol use: Yes    Alcohol/week: 7.0 standard drinks    Types: 7 Glasses of wine per week   Drug use: No     Allergies   Patient has no known allergies.   Review of Systems Review of Systems  Musculoskeletal: Positive for back pain.  All other systems reviewed and are negative.    Physical Exam Updated Vital Signs BP (!) 148/78    Pulse 70    Temp 98.1 F (36.7 C) (Oral)    Resp 16    Ht 5' 2.75" (1.594 m)    Wt 75.8 kg    SpO2 100%    BMI 29.82 kg/m   Physical Exam Vitals signs and nursing note reviewed.  Constitutional:      Comments: Uncomfortable   HENT:     Head: Normocephalic.     Nose: Nose normal.     Mouth/Throat:      Mouth: Mucous membranes are moist.  Eyes:     Extraocular Movements: Extraocular movements intact.     Pupils: Pupils are equal, round, and reactive to light.  Neck:     Musculoskeletal: Normal range of motion.  Cardiovascular:     Rate and Rhythm: Normal rate and regular rhythm.  Pulmonary:     Effort: Pulmonary effort is normal.     Breath sounds: Normal breath sounds.  Abdominal:     General: Abdomen is flat.     Palpations: Abdomen is soft.  Musculoskeletal:     Comments: + L SI joint vs paralumbar tenderness. + straight leg raise on the left side. No saddle anesthesia. Nl reflexes bilateral knees. Neurovascular intact bilateral lower extremities   Skin:    General: Skin is warm.     Capillary Refill: Capillary refill takes less than 2 seconds.  Neurological:     General: No focal deficit present.     Mental Status: She is alert.     Comments: No saddle anesthesia. Nl sensation bilateral lower extremities   Psychiatric:        Mood and Affect: Mood normal.      ED Treatments / Results  Labs (all labs ordered are listed, but only abnormal results are displayed) Labs Reviewed  COMPREHENSIVE METABOLIC PANEL - Abnormal; Notable for the following components:      Result Value   Glucose, Bld 113 (*)    All other components within normal limits  CBC WITH DIFFERENTIAL/PLATELET    EKG None  Radiology Dg Lumbar Spine Complete  Result Date: 05/29/2018 CLINICAL DATA:  Left-sided low back pain radiating to left hip. Symptoms began yesterday. EXAM: LUMBAR SPINE - COMPLETE 4+ VIEW COMPARISON:  None. FINDINGS: Mild curvature convex to the left, which could even be positional. No disc space narrowing. No vertebral body lesion. No evidence facet arthropathy or pars defect. IMPRESSION: No visible degenerative change or focal lesion. Mild curvature convex to the left which could  even be simply positional. Electronically Signed   By: Paulina Fusi M.D.   On: 05/29/2018 13:29     Procedures Procedures (including critical care time)  Medications Ordered in ED Medications  ondansetron (ZOFRAN) injection 4 mg (has no administration in time range)  diphenhydrAMINE (BENADRYL) injection 12.5 mg (has no administration in time range)  morphine 4 MG/ML injection 4 mg (4 mg Intravenous Given 05/29/18 1237)  diazepam (VALIUM) injection 2.5 mg (2.5 mg Intravenous Given 05/29/18 1235)  methylPREDNISolone sodium succinate (SOLU-MEDROL) 125 mg/2 mL injection 125 mg (125 mg Intravenous Given 05/29/18 1243)  ketorolac (TORADOL) 30 MG/ML injection 30 mg (30 mg Intravenous Given 05/29/18 1239)  HYDROmorphone (DILAUDID) injection 1 mg (1 mg Intravenous Given 05/29/18 1322)     Initial Impression / Assessment and Plan / ED Course  I have reviewed the triage vital signs and the nursing notes.  Pertinent labs & imaging results that were available during my care of the patient were reviewed by me and considered in my medical decision making (see chart for details).       Charlotte Petersen is a 53 y.o. female here with back pain. Has previous sciatica and was on gabapentin. 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 No saddle anesthesia and neurovascular intact in lower extremities so will not need MRI. Will get labs, xray lumbar spine. Will give pain meds, muscle relaxants, NSAIDs and steroids.   2:02 PM Xrays and labs unremarkable. Patient felt better with pain meds. States that she is little flushed after pain meds, which is likely from antihistamine release. No rash to suggest allergic reaction. Will dc home with steroid taper, vicodin prn. Reviewed narcotic database and patient isn't taking any narcotics currently.   Final Clinical Impressions(s) / ED Diagnoses   Final diagnoses:  None    ED Discharge Orders    None       Charlynne Pander, MD 05/29/18 [id number]

## 2018-05-29 NOTE — Telephone Encounter (Signed)
FYI. Pt at ED.  °

## 2018-05-29 NOTE — ED Notes (Signed)
Pt sts she feels better; tolerating po well.

## 2018-05-29 NOTE — ED Notes (Signed)
Pt slightly pale, diaphoretic; A & O, resp even/unlabored. Pt offered juice and agrees; cranberry juice and crackers given.

## 2018-05-29 NOTE — ED Notes (Signed)
Pt waived pregnancy test per radiology tech.  To radiology for study.

## 2018-05-29 NOTE — Telephone Encounter (Signed)
Noted, thank you

## 2018-05-29 NOTE — Telephone Encounter (Signed)
Copied from CRM [id number]. Topic: General - Other >> May 29, 2018 11:37 AM Trula Slade wrote: Reason for CRM:   Patient's husband called needing an appt for the patient ASAP because she is having severe hip pain. (Tried calling the scheduling line 3 times, but no answer) So the husband said he was going to take the patient to the ER.

## 2018-11-22 DIAGNOSIS — B001 Herpesviral vesicular dermatitis: Secondary | ICD-10-CM | POA: Insufficient documentation

## 2018-11-22 DIAGNOSIS — N951 Menopausal and female climacteric states: Secondary | ICD-10-CM | POA: Insufficient documentation

## 2018-11-22 LAB — HM PAP SMEAR

## 2018-12-06 LAB — HM MAMMOGRAPHY

## 2018-12-13 ENCOUNTER — Encounter: Payer: Self-pay | Admitting: Internal Medicine

## 2019-04-18 ENCOUNTER — Other Ambulatory Visit: Payer: Self-pay

## 2019-04-18 ENCOUNTER — Ambulatory Visit (INDEPENDENT_AMBULATORY_CARE_PROVIDER_SITE_OTHER): Payer: BC Managed Care – PPO | Admitting: Internal Medicine

## 2019-04-18 ENCOUNTER — Encounter: Payer: Self-pay | Admitting: Internal Medicine

## 2019-04-18 VITALS — BP 129/73 | HR 50 | Temp 96.9°F | Resp 16 | Ht 63.0 in | Wt 151.5 lb

## 2019-04-18 DIAGNOSIS — Z0000 Encounter for general adult medical examination without abnormal findings: Secondary | ICD-10-CM

## 2019-04-18 NOTE — Progress Notes (Signed)
Subjective:    Patient ID: Charlotte Petersen, female    DOB: 11-15-1965, 54 y.o.   MRN: [id number]  DOS:  04/18/2019 Type of visit - description: CPX Here for CPX Feeling well.  Doing great with lifestyle, + weight loss. Admits to some stress and occasional difficulty sleeping.  Wonders about melatonin.  Wt Readings from Last 3 Encounters:  04/18/19 151 lb 8 oz (68.7 kg)  05/29/18 167 lb (75.8 kg)  04/05/18 170 lb 2 oz (77.2 kg)     Review of Systems Occasionally has mild paresthesias, left upper extremity.  Other than above, a 14 point review of systems is negative     Past Medical History:  Diagnosis Date  . Ankle fracture 2010   no surgery  . Arthralgia of left temporomandibular joint    Dr. Redmond Baseman  . 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 Radiculopathy, cervical    L sided  . RLS (restless legs syndrome) 10/24/2012   all day long per pt.    Past Surgical History:  Procedure Laterality Date  . CESAREAN SECTION    . NECK SURGERY N/A 11/09/2015   Dr Justice Deeds - fussion  . PELVIC LAPAROSCOPY  1990s   Family History  Problem Relation Age of Onset  . Coronary artery disease Paternal Grandmother        <I  . Throat cancer Paternal Grandfather   . Lupus Maternal Aunt   . Hypertension Maternal Grandmother   . Heart murmur Father        F and B (MV repair)  . Breast cancer Other        aunt , young age, 27s  . Arthritis Mother   . CAD Mother        75  . Colon cancer Maternal Grandfather 57  . Diabetes Neg Hx   . Stroke Neg Hx   . Esophageal cancer Neg Hx   . Stomach cancer Neg Hx      Allergies as of 04/18/2019   No Known Allergies     Medication List       Accurate as of April 18, 2019 11:59 PM. If you have any questions, ask your nurse or doctor.        STOP taking these medications   COCONUT OIL PO Stopped by: Kathlene November, MD    HYDROcodone-acetaminophen 5-325 MG tablet Commonly known as: NORCO/VICODIN Stopped by: Kathlene November, MD   predniSONE 20 MG tablet Commonly known as: DELTASONE Stopped by: Kathlene November, MD     TAKE these medications   Advil 200 MG Caps Generic drug: Ibuprofen Take 1 capsule by mouth as needed.   b complex vitamins tablet Take 1 tablet by mouth daily.   cholecalciferol 1000 units tablet Commonly known as: VITAMIN D Take 1,000 Units by mouth daily.   Lysine 500 MG Caps Take 1 capsule by mouth daily.   MULTIVITAMIN GUMMIES ADULT PO Take 2 tablets by mouth daily.   OMEGA-3 FISH OIL PO Take 1 tablet by mouth daily.          Objective:   Physical Exam BP 129/73 (BP Location: Left Arm, Patient Position: Sitting, Cuff Size: Normal)   Pulse (!) 50   Temp (!) 96.9 F (36.1 C) (Temporal)   Resp 16   Ht 5\' 3"  (1.6 m)   Wt 151 lb 8 oz (68.7 kg)   SpO2 100%  BMI 26.84 kg/m  General: Well developed, NAD, BMI noted Neck: No  thyromegaly  HEENT:  Normocephalic . Face symmetric, atraumatic Lungs:  CTA B Normal respiratory effort, no intercostal retractions, no accessory muscle use. Heart: RRR,  no murmur.  Abdomen:  Not distended, soft, non-tender. No rebound or rigidity.   Lower extremities: no pretibial edema bilaterally  Skin: Exposed areas without rash. Not pale. Not jaundice Neurologic:  alert & oriented X3.  Speech normal, gait appropriate for age and unassisted Strength symmetric and appropriate for age. Motor and DTRs symmetric throughout Psych: Cognition and judgment appear intact.  Cooperative with normal attention span and concentration.  Behavior appropriate. No anxious or depressed appearing.     Assessment     Assessment RLS  2013, used to see Dr. Allena Katz, gabapentin helped but was requiring increasing doses, patient decided to wean -off gabapentin. Failed to improve with tonic water. Ankle fracture 2010, no surgery Tinnitus , serous otitis referred to  ENT 01-2015 Palpable Ao: 08-19-2007 (-) Vitamin D deficiency Neck surgery 10-2015- residual minimal problems at L arm Birth control: Husband's vasectomy  PLAN: Here for CPX RLS: Unchanged. Anxiety: Triggered by multiple issues including Covid quarantine.  Counseled.  Information about our counselors provided. Insomnia: Only sporadically, d/t to work related anxiety.  She wonders about taking melatonin and that is great.  Also tips for good sleep discussed. Occasional paresthesias, left arm: Neuro exam normal, history of cervical spinal  surgery before, was told to go back to them if problems, for now symptoms are really mild and she will monitor them. RTC 1 year   This visit occurred during the SARS-CoV-2 public health emergency.  Safety protocols were in place, including screening questions prior to the visit, additional usage of staff PPE, and extensive cleaning of exam room while observing appropriate contact time as indicated for disinfecting solutions.

## 2019-04-18 NOTE — Progress Notes (Signed)
Pre visit review using our clinic review tool, if applicable. No additional management support is needed unless otherwise documented below in the visit note. 

## 2019-04-18 NOTE — Patient Instructions (Signed)
GO TO THE LAB : Get the blood work     GO TO THE FRONT DESK Come back for a physical exam in 1 year, please make an appointment  Okay to take melatonin every night  Consider see a counselor

## 2019-04-19 LAB — CBC WITH DIFFERENTIAL/PLATELET
Absolute Monocytes: 655 cells/uL (ref 200–950)
Basophils Absolute: 60 cells/uL (ref 0–200)
Basophils Relative: 0.7 %
Eosinophils Absolute: 170 cells/uL (ref 15–500)
Eosinophils Relative: 2 %
HCT: 40.6 % (ref 35.0–45.0)
Hemoglobin: 14 g/dL (ref 11.7–15.5)
Lymphs Abs: 2567 cells/uL (ref 850–3900)
MCH: 31 pg (ref 27.0–33.0)
MCHC: 34.5 g/dL (ref 32.0–36.0)
MCV: 90 fL (ref 80.0–100.0)
MPV: 10.5 fL (ref 7.5–12.5)
Monocytes Relative: 7.7 %
Neutro Abs: 5049 cells/uL (ref 1500–7800)
Neutrophils Relative %: 59.4 %
Platelets: 250 10*3/uL (ref 140–400)
RBC: 4.51 10*6/uL (ref 3.80–5.10)
RDW: 12 % (ref 11.0–15.0)
Total Lymphocyte: 30.2 %
WBC: 8.5 10*3/uL (ref 3.8–10.8)

## 2019-04-19 LAB — COMPREHENSIVE METABOLIC PANEL
AG Ratio: 1.8 (calc) (ref 1.0–2.5)
ALT: 18 U/L (ref 6–29)
AST: 23 U/L (ref 10–35)
Albumin: 4.6 g/dL (ref 3.6–5.1)
Alkaline phosphatase (APISO): 68 U/L (ref 37–153)
BUN: 17 mg/dL (ref 7–25)
CO2: 26 mmol/L (ref 20–32)
Calcium: 9.9 mg/dL (ref 8.6–10.4)
Chloride: 104 mmol/L (ref 98–110)
Creat: 0.89 mg/dL (ref 0.50–1.05)
Globulin: 2.5 g/dL (calc) (ref 1.9–3.7)
Glucose, Bld: 83 mg/dL (ref 65–99)
Potassium: 4.1 mmol/L (ref 3.5–5.3)
Sodium: 142 mmol/L (ref 135–146)
Total Bilirubin: 0.7 mg/dL (ref 0.2–1.2)
Total Protein: 7.1 g/dL (ref 6.1–8.1)

## 2019-04-19 LAB — LIPID PANEL
Cholesterol: 196 mg/dL (ref <200)
HDL: 68 mg/dL (ref >=50)
LDL Cholesterol (Calc): 109 mg/dL (calc) — ABNORMAL HIGH
Non-HDL Cholesterol (Calc): 128 mg/dL (calc) (ref <130)
Total CHOL/HDL Ratio: 2.9 (calc) (ref <5.0)
Triglycerides: 92 mg/dL (ref <150)

## 2019-04-19 LAB — TSH: TSH: 2.39 mIU/L

## 2019-04-20 NOTE — Assessment & Plan Note (Signed)
-   Td  2016 - covid shot  x 2  -CCS: cscope 07/2017 Dr Christella Hartigan , 10 years per report -Female care per gynecology (PAPs, MMGs ) - LMP 05/2018 .  Likely perimenopausal.  DEXA will be indicated 5 years after menopause. - Palpable aorta, ultrasound 7- 07-2007 negative - Labs: CMP, FLP, CBC, TSH -Diet and exercise: Doing great, has lost a significant amount of weight by eating healthy and exercising regularly

## 2019-04-20 NOTE — Assessment & Plan Note (Signed)
Here for CPX RLS: Unchanged. Anxiety: Triggered by multiple issues including Covid quarantine.  Counseled.  Information about our counselors provided. Insomnia: Only sporadically, d/t to work related anxiety.  She wonders about taking melatonin and that is great.  Also tips for good sleep discussed. Occasional paresthesias, left arm: Neuro exam normal, history of cervical spinal  surgery before, was told to go back to them if problems, for now symptoms are really mild and she will monitor them. RTC 1 year

## 2019-09-09 ENCOUNTER — Emergency Department (HOSPITAL_BASED_OUTPATIENT_CLINIC_OR_DEPARTMENT_OTHER)
Admission: EM | Admit: 2019-09-09 | Discharge: 2019-09-09 | Disposition: A | Discharge status: Home / Self Care | Payer: BC Managed Care – PPO | Attending: Emergency Medicine | Admitting: Emergency Medicine

## 2019-09-09 ENCOUNTER — Emergency Department (HOSPITAL_BASED_OUTPATIENT_CLINIC_OR_DEPARTMENT_OTHER): Payer: BC Managed Care – PPO

## 2019-09-09 ENCOUNTER — Other Ambulatory Visit: Payer: Self-pay

## 2019-09-09 ENCOUNTER — Encounter (HOSPITAL_BASED_OUTPATIENT_CLINIC_OR_DEPARTMENT_OTHER): Payer: Self-pay | Admitting: *Deleted

## 2019-09-09 DIAGNOSIS — Z800 Family history of malignant neoplasm of digestive organs: Secondary | ICD-10-CM | POA: Diagnosis not present

## 2019-09-09 DIAGNOSIS — R319 Hematuria, unspecified: Secondary | ICD-10-CM | POA: Insufficient documentation

## 2019-09-09 DIAGNOSIS — Z803 Family history of malignant neoplasm of breast: Secondary | ICD-10-CM | POA: Insufficient documentation

## 2019-09-09 DIAGNOSIS — Z79899 Other long term (current) drug therapy: Secondary | ICD-10-CM | POA: Diagnosis not present

## 2019-09-09 DIAGNOSIS — M549 Dorsalgia, unspecified: Secondary | ICD-10-CM | POA: Diagnosis not present

## 2019-09-09 DIAGNOSIS — Z801 Family history of malignant neoplasm of trachea, bronchus and lung: Secondary | ICD-10-CM | POA: Diagnosis not present

## 2019-09-09 DIAGNOSIS — N201 Calculus of ureter: Secondary | ICD-10-CM | POA: Diagnosis not present

## 2019-09-09 DIAGNOSIS — R1110 Vomiting, unspecified: Secondary | ICD-10-CM | POA: Diagnosis present

## 2019-09-09 LAB — COMPREHENSIVE METABOLIC PANEL
ALT: 19 U/L (ref 0–44)
AST: 23 U/L (ref 15–41)
Albumin: 4.2 g/dL (ref 3.5–5.0)
Alkaline Phosphatase: 56 U/L (ref 38–126)
Anion gap: 12 (ref 5–15)
BUN: 19 mg/dL (ref 6–20)
CO2: 24 mmol/L (ref 22–32)
Calcium: 8.8 mg/dL — ABNORMAL LOW (ref 8.9–10.3)
Chloride: 101 mmol/L (ref 98–111)
Creatinine, Ser: 0.95 mg/dL (ref 0.44–1.00)
GFR calc Af Amer: >60 mL/min (ref >60)
GFR calc non Af Amer: >60 mL/min (ref >60)
Glucose, Bld: 121 mg/dL — ABNORMAL HIGH (ref 70–99)
Potassium: 3.8 mmol/L (ref 3.5–5.1)
Sodium: 137 mmol/L (ref 135–145)
Total Bilirubin: 0.6 mg/dL (ref 0.3–1.2)
Total Protein: 7.2 g/dL (ref 6.5–8.1)

## 2019-09-09 LAB — URINALYSIS, MICROSCOPIC (REFLEX): RBC / HPF: >50 RBC/hpf (ref 0–5)

## 2019-09-09 LAB — CBC WITH DIFFERENTIAL/PLATELET
Abs Immature Granulocytes: 0.19 10*3/uL — ABNORMAL HIGH (ref 0.00–0.07)
Basophils Absolute: 0.1 10*3/uL (ref 0.0–0.1)
Basophils Relative: 1 %
Eosinophils Absolute: 0.1 10*3/uL (ref 0.0–0.5)
Eosinophils Relative: 1 %
HCT: 39.5 % (ref 36.0–46.0)
Hemoglobin: 13.6 g/dL (ref 12.0–15.0)
Immature Granulocytes: 2 %
Lymphocytes Relative: 16 %
Lymphs Abs: 1.9 10*3/uL (ref 0.7–4.0)
MCH: 31.3 pg (ref 26.0–34.0)
MCHC: 34.4 g/dL (ref 30.0–36.0)
MCV: 90.8 fL (ref 80.0–100.0)
Monocytes Absolute: 0.8 10*3/uL (ref 0.1–1.0)
Monocytes Relative: 7 %
Neutro Abs: 9.2 10*3/uL — ABNORMAL HIGH (ref 1.7–7.7)
Neutrophils Relative %: 73 %
Platelets: 222 10*3/uL (ref 150–400)
RBC: 4.35 MIL/uL (ref 3.87–5.11)
RDW: 12.1 % (ref 11.5–15.5)
WBC: 12.2 10*3/uL — ABNORMAL HIGH (ref 4.0–10.5)
nRBC: 0 % (ref 0.0–0.2)

## 2019-09-09 LAB — URINALYSIS, ROUTINE W REFLEX MICROSCOPIC
Bilirubin Urine: NEGATIVE
Glucose, UA: NEGATIVE mg/dL
Ketones, ur: NEGATIVE mg/dL
Nitrite: NEGATIVE
Protein, ur: 100 mg/dL — AB
Specific Gravity, Urine: >1.03 — ABNORMAL HIGH (ref 1.005–1.030)
pH: 5.5 (ref 5.0–8.0)

## 2019-09-09 MED ORDER — ONDANSETRON HCL 4 MG/2ML IJ SOLN
4.0000 mg | Freq: Once | INTRAMUSCULAR | Status: AC
  Administered 2019-09-09: 4 mg via INTRAVENOUS
  Filled 2019-09-09: qty 2

## 2019-09-09 MED ORDER — TAMSULOSIN HCL 0.4 MG PO CAPS: 0.4000 mg | ORAL_CAPSULE | Freq: Every day | ORAL | 0 refills | Status: AC

## 2019-09-09 MED ORDER — SODIUM CHLORIDE 0.9 % IV BOLUS
1000.0000 mL | Freq: Once | INTRAVENOUS | Status: AC
  Administered 2019-09-09: 1000 mL via INTRAVENOUS

## 2019-09-09 MED ORDER — KETOROLAC TROMETHAMINE 30 MG/ML IJ SOLN
30.0000 mg | Freq: Once | INTRAMUSCULAR | Status: AC
  Administered 2019-09-09: 30 mg via INTRAVENOUS
  Filled 2019-09-09: qty 1

## 2019-09-09 MED ORDER — MORPHINE SULFATE (PF) 4 MG/ML IV SOLN
4.0000 mg | Freq: Once | INTRAVENOUS | Status: AC
  Administered 2019-09-09: 4 mg via INTRAVENOUS
  Filled 2019-09-09: qty 1

## 2019-09-09 MED ORDER — HYDROCODONE-ACETAMINOPHEN 5-325 MG PO TABS: 2.0000 | ORAL_TABLET | ORAL | 0 refills | Status: DC | PRN

## 2019-09-09 MED ORDER — ONDANSETRON 4 MG PO TBDP: 4.0000 mg | ORAL_TABLET | Freq: Three times a day (TID) | ORAL | 0 refills | Status: DC | PRN

## 2019-09-09 NOTE — ED Triage Notes (Signed)
Back pain. Hematuria. Hx of recent UTI.

## 2019-09-09 NOTE — ED Provider Notes (Signed)
[facility] EMERGENCY DEPARTMENT Provider Note   CSN: [id number] Arrival date & time: 09/09/19  2035     History Chief Complaint  Patient presents with  . Back Pain  . Emesis    Charlotte Petersen is a 54 y.o. female.  54 year old female with complaint of hematuria and back pain.  Patient states she first noticed blood in her urine on Friday, had return of blood in her urine on Sunday.  Patient did not think anything of it as she was not having any dysuria.  Today patient developed pain in her left flank, sudden severe spasm in nature associated with nausea.  Patient states pain has eased as of this time and she is feeling better.  No history of prior kidney stones, denies fevers, chills.  No other complaints or concerns.        Past Medical History:  Diagnosis Date  . Ankle fracture 2010   no surgery  . Arthralgia of left temporomandibular joint    Dr. Bates  . 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 Radiculopathy, cervical    L sided  . RLS (restless legs syndrome) 10/24/2012   all day long per pt.    Patient Active Problem List   Diagnosis Date Noted  . 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 Perimenopause 11/22/2018  . PCP NOTES >>>>> 12/21/2014  . RLS (restless legs syndrome) 10/24/2012  . Annual physical exam 06/13/2010    Past Surgical History:  Procedure Laterality Date  . CESAREAN SECTION    . NECK SURGERY N/A 11/09/2015   Dr Dumonsky--diskectomy - fussion  . PELVIC LAPAROSCOPY  1990s     OB History    Gravida  2   Para      Term      Preterm      AB      Living        SAB      TAB      Ectopic      Multiple      Live Births              Family History  Problem Relation Age of Onset  . Coronary artery disease Paternal Grandmother        <I  . Throat cancer Paternal Grandfather   . Lupus Maternal Aunt   . Hypertension Maternal  Grandmother   . Heart murmur Father        F and B (MV repair)  . Breast cancer Other        aunt , young age, 40s  . Arthritis Mother   . CAD Mother        77   . Colon cancer Maternal Grandfather 52  . Diabetes Neg Hx   . Stroke Neg Hx   . Esophageal cancer Neg Hx   . Stomach cancer Neg Hx     Social History   Tobacco Use  . Smoking status: Never Smoker  . Smokeless tobacco: Never Used  Vaping Use  . Vaping Use: Never used  Substance Use Topics  . Alcohol use: Yes    Alcohol/week: 7.0 standard drinks    Types: 7 Glasses of wine per week  . Drug use: No    Home Medications Prior to Admission medications   Medication Sig Start Date End Date Taking? Authorizing Provider  b complex vitamins tablet Take 1 tablet by mouth  daily.      [provider]  cholecalciferol (VITAMIN D) 1000 units tablet Take 1,000 Units by mouth daily.    [provider]  HYDROcodone-acetaminophen (NORCO/VICODIN) 5-325 MG tablet Take 2 tablets by mouth every 4 (four) hours as needed. 09/09/19   Jeannie FendMurphy, Maya Scholer A, PA-C  Ibuprofen (ADVIL) 200 MG CAPS Take 1 capsule by mouth as needed.     [provider]  Lysine 500 MG CAPS Take 1 capsule by mouth daily.     [provider]  Multiple Vitamins-Minerals (MULTIVITAMIN GUMMIES ADULT PO) Take 2 tablets by mouth daily.     [provider]  Omega-3 Fatty Acids (OMEGA-3 FISH OIL PO) Take 1 tablet by mouth daily.    [provider]  ondansetron (ZOFRAN ODT) 4 MG disintegrating tablet Take 1 tablet (4 mg total) by mouth every 8 (eight) hours as needed for nausea or vomiting. 09/09/19   Jeannie FendMurphy, Christasia Angeletti A, PA-C  tamsulosin (FLOMAX) 0.4 MG CAPS capsule Take 1 capsule (0.4 mg total) by mouth daily. 09/09/19 10/09/19  Jeannie FendMurphy, Swayzee Wadley A, PA-C    Allergies    Patient has no known allergies.  Review of Systems   Review of Systems  Constitutional: Negative for fever.  Respiratory: Negative for shortness of breath.     Cardiovascular: Negative for chest pain.  Gastrointestinal: Positive for nausea. Negative for abdominal pain, constipation, diarrhea and vomiting.  Genitourinary: Positive for flank pain and hematuria. Negative for dysuria and frequency.  Musculoskeletal: Positive for back pain.  Skin: Negative for rash and wound.  Allergic/Immunologic: Negative for immunocompromised state.  Neurological: Negative for weakness.  All other systems reviewed and are negative.   Physical Exam Updated Vital Signs BP 108/67 (BP Location: Right Arm)   Pulse 57   Temp 98.1 F (36.7 C) (Oral)   Resp 14   Ht 5\' 3"  (1.6 m)   Wt 68.5 kg   SpO2 96%   BMI 26.75 kg/m   Physical Exam Vitals and nursing note reviewed.  Constitutional:      General: She is not in acute distress.    Appearance: She is well-developed. She is not diaphoretic.  HENT:     Head: Normocephalic and atraumatic.  Cardiovascular:     Rate and Rhythm: Normal rate and regular rhythm.     Pulses: Normal pulses.     Heart sounds: Normal heart sounds.  Pulmonary:     Effort: Pulmonary effort is normal.     Breath sounds: Normal breath sounds.  Abdominal:     Palpations: Abdomen is soft.     Tenderness: There is no abdominal tenderness. There is no right CVA tenderness or left CVA tenderness.  Musculoskeletal:     Right lower leg: No edema.     Left lower leg: No edema.  Skin:    General: Skin is warm and dry.     Findings: No erythema or rash.  Neurological:     Mental Status: She is alert and oriented to person, place, and time.  Psychiatric:        Behavior: Behavior normal.     ED Results / Procedures / Treatments   Labs (all labs ordered are listed, but only abnormal results are displayed) Labs Reviewed  URINALYSIS, ROUTINE W REFLEX MICROSCOPIC - Abnormal; Notable for the following components:      Result Value   Specific Gravity, Urine >1.030 (*)    Hgb urine dipstick LARGE (*)    Protein, ur 100 (*)  Leukocytes,Ua SMALL (*)    All other components within normal limits  CBC WITH DIFFERENTIAL/PLATELET - Abnormal; Notable for the following components:   WBC 12.2 (*)    Neutro Abs 9.2 (*)    Abs Immature Granulocytes 0.19 (*)    All other components within normal limits  COMPREHENSIVE METABOLIC PANEL - Abnormal; Notable for the following components:   Glucose, Bld 121 (*)    Calcium 8.8 (*)    All other components within normal limits  URINALYSIS, MICROSCOPIC (REFLEX) - Abnormal; Notable for the following components:   Bacteria, UA MANY (*)    All other components within normal limits  URINE CULTURE    EKG None  Radiology CT Renal Stone Study  Result Date: 09/09/2019 CLINICAL DATA:  Flank pain and back pain EXAM: CT ABDOMEN AND PELVIS WITHOUT CONTRAST TECHNIQUE: Multidetector CT imaging of the abdomen and pelvis was performed following the standard protocol without IV contrast. COMPARISON:  None. FINDINGS: Lower chest: The visualized heart size within normal limits. No pericardial fluid/thickening. No hiatal hernia. The visualized portions of the lungs are clear. Hepatobiliary: Although limited due to the lack of intravenous contrast, normal in appearance without gross focal abnormality. No evidence of calcified gallstones or biliary ductal dilatation. Pancreas:  Unremarkable.  No surrounding inflammatory changes. Spleen: Normal in size. Although limited due to the lack of intravenous contrast, normal in appearance. Adrenals/Urinary Tract: Both adrenal glands appear normal. There is mild left-sided pelviectasis and proximal ureterectasis to the level of the proximal ureter where there is a punctate 2 mm calculus. Mild proximal left periureteral stranding is seen. No right-sided renal or collecting system calculi are noted. The bladder is unremarkable. Within the upper pole of the right kidney there is a 2 cm low-density lesion seen within the upper pole. Stomach/Bowel: The stomach, small  bowel, and colon are normal in appearance. No inflammatory changes or obstructive findings. There is a moderate amount of colonic stool present. Vascular/Lymphatic: There are no enlarged abdominal or pelvic lymph nodes. No significant gross vascular findings are present. Reproductive:  The uterus and adnexa are unremarkable. Other: No evidence of abdominal wall mass or hernia. Musculoskeletal: No acute or significant osseous findings. IMPRESSION: Punctate 2 mm proximal left ureteral calculus causing mild left pelviectasis with stranding. Electronically Signed   By: Jonna Clark M.D.   On: 09/09/2019 22:15    Procedures Procedures (including critical care time)  Medications Ordered in ED Medications  ondansetron (ZOFRAN) injection 4 mg (4 mg Intravenous Given 09/09/19 2134)  morphine 4 MG/ML injection 4 mg (4 mg Intravenous Given 09/09/19 2135)  sodium chloride 0.9 % bolus 1,000 mL ( Intravenous Stopped 09/09/19 2311)  ketorolac (TORADOL) 30 MG/ML injection 30 mg (30 mg Intravenous Given 09/09/19 2336)  ondansetron (ZOFRAN) injection 4 mg (4 mg Intravenous Given 09/09/19 2336)    ED Course  I have reviewed the triage vital signs and the nursing notes.  Pertinent labs & imaging results that were available during my care of the patient were reviewed by me and considered in my medical decision making (see chart for details).  Clinical Course as of Sep 09 2343  Tue Sep 09, 2019  5321 54 year old female presents with complaint of hematuria and left flank pain with nausea, denies fever.  Pain had resolved at time of exam.  CT shows a 2 mm proximal left stone.  Patient does have an increase in white count to 12.2, CMP with normal renal function, urinalysis with large hemoglobin, protein, small leukocytes  with many bacteria however it is a contaminated sample.  Urine will be sent for culture and treated if necessary.  Discussed with Dr. Judd Lien, ER attending regarding concern for questionable UTI with increased  white count and kidney stone.  Patient is pain-free, afebrile, without dysuria, plan remains same to follow-up for urine culture, referred to urology for follow-up.  Patient was given Toradol at discharge as she states she started to feel slightly uncomfortable.  Given prescription for Norco, Flomax, Zofran.  Patient to return to ER for fevers, worsening symptoms or pain or vomiting not controlled with medications.   [LM]    Clinical Course User Index [LM] Alden Hipp   MDM Rules/Calculators/A&P                          Final Clinical Impression(s) / ED Diagnoses Final diagnoses:  Ureterolithiasis    Rx / DC Orders ED Discharge Orders         Ordered    HYDROcodone-acetaminophen (NORCO/VICODIN) 5-325 MG tablet  Every 4 hours PRN     Discontinue  Reprint     09/09/19 2327    ondansetron (ZOFRAN ODT) 4 MG disintegrating tablet  Every 8 hours PRN     Discontinue  Reprint     09/09/19 2327    tamsulosin (FLOMAX) 0.4 MG CAPS capsule  Daily     Discontinue  Reprint     09/09/19 2327           Jeannie Fend, PA-C 09/09/19 2345    Geoffery Lyons, MD 09/10/19 1542

## 2019-09-09 NOTE — Discharge Instructions (Signed)
Take Norco as needed as prescribed for pain. Take Zofran as needed as prescribed for nausea and vomiting. Take Flomax daily. Take Colace as needed for constipation. Take Motrin as needed as directed for pain.  Follow-up with your PCP or see urology.  Return to ER for fever or vomiting or pain not controlled with medications.

## 2019-09-11 LAB — URINE CULTURE: Culture: <10000 — AB

## 2020-01-05 LAB — HM MAMMOGRAPHY

## 2020-01-28 ENCOUNTER — Encounter: Payer: Self-pay | Admitting: Internal Medicine

## 2020-02-18 DIAGNOSIS — N711 Chronic inflammatory disease of uterus: Secondary | ICD-10-CM | POA: Insufficient documentation

## 2020-02-18 DIAGNOSIS — N950 Postmenopausal bleeding: Secondary | ICD-10-CM | POA: Insufficient documentation

## 2020-04-23 ENCOUNTER — Encounter: Payer: Self-pay | Admitting: Internal Medicine

## 2020-04-23 ENCOUNTER — Other Ambulatory Visit: Payer: Self-pay

## 2020-04-23 ENCOUNTER — Ambulatory Visit (INDEPENDENT_AMBULATORY_CARE_PROVIDER_SITE_OTHER): Payer: BC Managed Care – PPO | Admitting: Internal Medicine

## 2020-04-23 VITALS — BP 116/68 | HR 63 | Temp 97.5°F | Resp 16 | Ht 63.0 in | Wt 160.1 lb

## 2020-04-23 DIAGNOSIS — Z1159 Encounter for screening for other viral diseases: Secondary | ICD-10-CM

## 2020-04-23 DIAGNOSIS — Z23 Encounter for immunization: Secondary | ICD-10-CM | POA: Diagnosis not present

## 2020-04-23 DIAGNOSIS — E559 Vitamin D deficiency, unspecified: Secondary | ICD-10-CM

## 2020-04-23 DIAGNOSIS — Z0000 Encounter for general adult medical examination without abnormal findings: Secondary | ICD-10-CM | POA: Diagnosis not present

## 2020-04-23 LAB — CBC WITH DIFFERENTIAL/PLATELET
Monocytes Relative: 8.5 %
Total Lymphocyte: 34.6 %

## 2020-04-23 NOTE — Progress Notes (Signed)
Subjective:    Patient ID: Charlotte Petersen, female    DOB: 02-24-1965, 55 y.o.   MRN: [id number]  DOS:  04/23/2020 Type of visit - description: CPX Since the last office visit, she was seen at the ER with a kidney stone. After that ER visit she remains completely asymptomatic. Also was evaluated by gynecology. Currently doing very well.  Wt Readings from Last 3 Encounters:  04/23/20 160 lb 2 oz (72.6 kg)  09/09/19 151 lb (68.5 kg)  04/18/19 151 lb 8 oz (68.7 kg)     Review of Systems  Other than above, a 14 point review of systems is negative      Past Medical History:  Diagnosis Date  . Ankle fracture 2010   no surgery  . Arthralgia of left temporomandibular joint    Dr. Jenne Pane  . 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 Radiculopathy, cervical    L sided  . RLS (restless legs syndrome) 10/24/2012   all day long per pt.    Past Surgical History:  Procedure Laterality Date  . CESAREAN SECTION    . NECK SURGERY N/A 11/09/2015   Dr Randell Patient - fussion  . PELVIC LAPAROSCOPY  1990s    Allergies as of 04/23/2020   No Known Allergies     Medication List       Accurate as of April 23, 2020 11:59 PM. If you have any questions, ask your nurse or doctor.        STOP taking these medications   HYDROcodone-acetaminophen 5-325 MG tablet Commonly known as: NORCO/VICODIN Stopped by: Willow Ora, MD   Ibuprofen 200 MG Caps Stopped by: Willow Ora, MD   ondansetron 4 MG disintegrating tablet Commonly known as: Zofran ODT Stopped by: Willow Ora, MD     TAKE these medications   b complex vitamins tablet Take 1 tablet by mouth daily.   cholecalciferol 1000 units tablet Commonly known as: VITAMIN D Take 1,000 Units by mouth daily.   Lysine 500 MG Caps Take 1 capsule by mouth daily.   MULTIVITAMIN GUMMIES ADULT PO Take 2 tablets by mouth daily.   OMEGA-3 FISH OIL  PO Take 1 tablet by mouth daily.          Objective:   Physical Exam BP 116/68 (BP Location: Left Arm, Patient Position: Sitting, Cuff Size: Small)   Pulse 63   Temp (!) 97.5 F (36.4 C) (Oral)   Resp 16   Ht 5\' 3"  (1.6 m)   Wt 160 lb 2 oz (72.6 kg)   SpO2 97%   BMI 28.36 kg/m  General: Well developed, NAD, BMI noted Neck: No  thyromegaly  HEENT:  Normocephalic . Face symmetric, atraumatic Lungs:  CTA B Normal respiratory effort, no intercostal retractions, no accessory muscle use. Heart: RRR,  no murmur.  Abdomen:  Not distended, soft, non-tender. No rebound or rigidity.   Lower extremities: no pretibial edema bilaterally  Skin: Exposed areas without rash. Not pale. Not jaundice Neurologic:  alert & oriented X3.  Speech normal, gait appropriate for age and unassisted Strength symmetric and appropriate for age.  Psych: Cognition and judgment appear intact.  Cooperative with normal attention span and concentration.  Behavior appropriate. No anxious or depressed appearing.     Assessment     Assessment RLS  2013, used to see Dr. 2014, gabapentin helped but was requiring increasing doses, patient decided  to wean -off gabapentin. Failed to improve with tonic water. Ankle fracture 2010, no surgery Tinnitus , serous otitis referred to ENT 01-2015 Palpable Ao: 08-19-2007 (-) Vitamin D deficiency Neck surgery 10-2015- residual minimal problems at L arm Kidney Stone 7/20221 (first episode) Birth control: husband vasectomy  PLAN: Here for CPX RLS: Still has some sxs, on no medications Vitamin D deficiency: Last levels very good. Kidney stones 08-2019, left-sided, asx since the ER visit, d/w pt  possibly check  a renal ultrasound to document resolution of the issue, declined at this time,  will call if she has any LUTS. RTC 1 year   This visit occurred during the SARS-CoV-2 public health emergency.  Safety protocols were in place, including screening questions prior  to the visit, additional usage of staff PPE, and extensive cleaning of exam room while observing appropriate contact time as indicated for disinfecting solutions.

## 2020-04-23 NOTE — Patient Instructions (Addendum)
GO TO THE LAB : Get the blood work     GO TO THE FRONT DESK, PLEASE SCHEDULE YOUR APPOINTMENTS Come back for   your second dose of Shingrix in 2 or 3 months  Come back for a physical exam in 1 year   Advance Directive  Advance directives are legal documents that allow you to make decisions about your health care and medical treatment in case you become unable to communicate for yourself. Advance directives let your wishes be known to family, friends, and health care providers. Discussing and writing advance directives should happen over time rather than all at once. Advance directives can be changed and updated at any time. There are different types of advance directives, such as:  Medical power of attorney.  Living will.  Do not resuscitate (DNR) order or do not attempt resuscitation (DNAR) order. Health care proxy and medical power of attorney A health care proxy is also called a health care agent. This person is appointed to make medical decisions for you when you are unable to make decisions for yourself. Generally, people ask a trusted friend or family member to act as their proxy and represent their preferences. Make sure you have an agreement with your trusted person to act as your proxy. A proxy may have to make a medical decision on your behalf if your wishes are not known. A medical power of attorney, also called a durable power of attorney for health care, is a legal document that names your health care proxy. Depending on the laws in your state, the document may need to be:  Signed.  Notarized.  Dated.  Copied.  Witnessed.  Incorporated into your medical record. You may also want to appoint a trusted person to manage your money in the event you are unable to do so. This is called a durable power of attorney for finances. It is a separate legal document from the durable power of attorney for health care. You may choose your health care proxy or someone different to act  as your agent in money matters. If you do not appoint a proxy, or there is a concern that the proxy is not acting in your best interest, a court may appoint a guardian to act on your behalf. Living will A living will is a set of instructions that state your wishes about medical care when you cannot express them yourself. Health care providers should keep a copy of your living will in your medical record. You may want to give a copy to family members or friends. To alert caregivers in case of an emergency, you can place a card in your wallet to let them know that you have a living will and where they can find it. A living will is used if you become:  Terminally ill.  Disabled.  Unable to communicate or make decisions. The following decisions should be included in your living will:  To use or not to use life support equipment, such as dialysis machines and breathing machines (ventilators).  Whether you want a DNR or DNAR order. This tells health care providers not to use cardiopulmonary resuscitation (CPR) if breathing or heartbeat stops.  To use or not to use tube feeding.  To be given or not to be given food and fluids.  Whether you want comfort (palliative) care when the goal becomes comfort rather than a cure.  Whether you want to donate your organs and tissues. A living will does not give instructions for distributing  your money and property if you should pass away. DNR or DNAR A DNR or DNAR order is a request not to have CPR in the event that your heart stops beating or you stop breathing. If a DNR or DNAR order has not been made and shared, a health care provider will try to help any patient whose heart has stopped or who has stopped breathing. If you plan to have surgery, talk with your health care provider about how your DNR or DNAR order will be followed if problems occur. What if I do not have an advance directive? Some states assign family decision makers to act on your behalf if  you do not have an advance directive. Each state has its own laws about advance directives. You may want to check with your health care provider, attorney, or state representative about the laws in your state. Summary  Advance directives are legal documents that allow you to make decisions about your health care and medical treatment in case you become unable to communicate for yourself.  The process of discussing and writing advance directives should happen over time. You can change and update advance directives at any time.  Advance directives may include a medical power of attorney, a living will, and a DNR or DNAR order. This information is not intended to replace advice given to you by your health care provider. Make sure you discuss any questions you have with your health care provider. Document Revised: 11/04/2019 Document Reviewed: 11/04/2019 Elsevier Patient Education  2021 [employer].

## 2020-04-24 LAB — COMPREHENSIVE METABOLIC PANEL
AG Ratio: 2 (calc) (ref 1.0–2.5)
ALT: 19 U/L (ref 6–29)

## 2020-04-25 ENCOUNTER — Encounter: Payer: Self-pay | Admitting: Internal Medicine

## 2020-04-25 NOTE — Assessment & Plan Note (Signed)
-   Td 2016 - shingrix #1 today, next in 2 to 3 months - covid shot  x 3  - had a flu shot  -TAV:WPVXYI 07/2017 Dr Christella Hartigan , 10 years per report -Female care per gynecology, last seen 02-18-2020 (PAPs, MMGs ) - LMP 05/2018.  Likely perimenopausal.  DEXA will be indicated 5 years after menopause. - Palpable aorta, ultrasound 7- 07-2007 negative - Labs: CMP, FLP, CBC, hep C -Diet and exercise: Was doing great,until  Christmas, working on her diet at this point. She remains very active, goes to the gym -Advance care discussed

## 2020-04-25 NOTE — Assessment & Plan Note (Signed)
Here for CPX RLS: Still has some sxs, on no medications Vitamin D deficiency: Last levels very good. Kidney stones 08-2019, left-sided, asx since the ER visit, d/w pt  possibly check  a renal ultrasound to document resolution of the issue, declined at this time,  will call if she has any LUTS. RTC 1 year

## 2020-04-26 LAB — COMPREHENSIVE METABOLIC PANEL
AST: 21 U/L (ref 10–35)
Albumin: 4.4 g/dL (ref 3.6–5.1)
Alkaline phosphatase (APISO): 50 U/L (ref 37–153)
BUN: 15 mg/dL (ref 7–25)
CO2: 25 mmol/L (ref 20–32)
Calcium: 9.3 mg/dL (ref 8.6–10.4)
Chloride: 103 mmol/L (ref 98–110)
Creat: 0.72 mg/dL (ref 0.50–1.05)
Globulin: 2.2 g/dL (calc) (ref 1.9–3.7)
Glucose, Bld: 82 mg/dL (ref 65–99)
Potassium: 3.9 mmol/L (ref 3.5–5.3)
Sodium: 139 mmol/L (ref 135–146)
Total Bilirubin: 0.7 mg/dL (ref 0.2–1.2)
Total Protein: 6.6 g/dL (ref 6.1–8.1)

## 2020-04-26 LAB — LIPID PANEL
Cholesterol: 217 mg/dL — ABNORMAL HIGH (ref <200)
HDL: 73 mg/dL (ref >=50)
LDL Cholesterol (Calc): 127 mg/dL (calc) — ABNORMAL HIGH
Non-HDL Cholesterol (Calc): 144 mg/dL (calc) — ABNORMAL HIGH (ref <130)
Total CHOL/HDL Ratio: 3 (calc) (ref <5.0)
Triglycerides: 77 mg/dL (ref <150)

## 2020-04-26 LAB — CBC WITH DIFFERENTIAL/PLATELET
Absolute Monocytes: 621 cells/uL (ref 200–950)
Basophils Absolute: 58 cells/uL (ref 0–200)
Basophils Relative: 0.8 %
Eosinophils Absolute: 153 cells/uL (ref 15–500)
Eosinophils Relative: 2.1 %
HCT: 37.6 % (ref 35.0–45.0)
Hemoglobin: 13.2 g/dL (ref 11.7–15.5)
Lymphs Abs: 2526 cells/uL (ref 850–3900)
MCH: 31.9 pg (ref 27.0–33.0)
MCHC: 35.1 g/dL (ref 32.0–36.0)
MCV: 90.8 fL (ref 80.0–100.0)
MPV: 10.3 fL (ref 7.5–12.5)
Neutro Abs: 3942 cells/uL (ref 1500–7800)
Neutrophils Relative %: 54 %
Platelets: 220 10*3/uL (ref 140–400)
RBC: 4.14 10*6/uL (ref 3.80–5.10)
RDW: 12.3 % (ref 11.0–15.0)
WBC: 7.3 10*3/uL (ref 3.8–10.8)

## 2020-04-26 LAB — HEPATITIS C ANTIBODY
Hepatitis C Ab: NONREACTIVE
SIGNAL TO CUT-OFF: 0 (ref <1.00)

## 2020-04-26 LAB — VITAMIN D 25 HYDROXY (VIT D DEFICIENCY, FRACTURES): Vit D, 25-Hydroxy: 31 ng/mL (ref 30–100)

## 2020-06-18 ENCOUNTER — Other Ambulatory Visit: Payer: Self-pay

## 2020-06-18 ENCOUNTER — Ambulatory Visit (INDEPENDENT_AMBULATORY_CARE_PROVIDER_SITE_OTHER): Payer: BC Managed Care – PPO | Admitting: *Deleted

## 2020-06-18 DIAGNOSIS — Z23 Encounter for immunization: Secondary | ICD-10-CM | POA: Diagnosis not present

## 2020-06-18 NOTE — Progress Notes (Signed)
Patient here for second shingles vaccine.  Vaccine given in right deltoid and patient tolerated well. 

## 2020-11-17 DIAGNOSIS — Z23 Encounter for immunization: Secondary | ICD-10-CM | POA: Diagnosis not present

## 2020-12-09 IMAGING — CT CT RENAL STONE PROTOCOL
2 of 4 series · 16 of 46 positions shown, 18 images · non-contrast
Comparison: None.

CLINICAL DATA: Flank pain and back pain

EXAM:
CT ABDOMEN AND PELVIS WITHOUT CONTRAST
TECHNIQUE: Multidetector CT imaging of the abdomen and pelvis was performed
following the standard protocol without IV contrast.

[Series 2: axial st · axial · 0.76mm/px · z∈[-653,-233]mm · 13 of 93 slices shown, 15 images]
[im 5/93  soft-tissue]
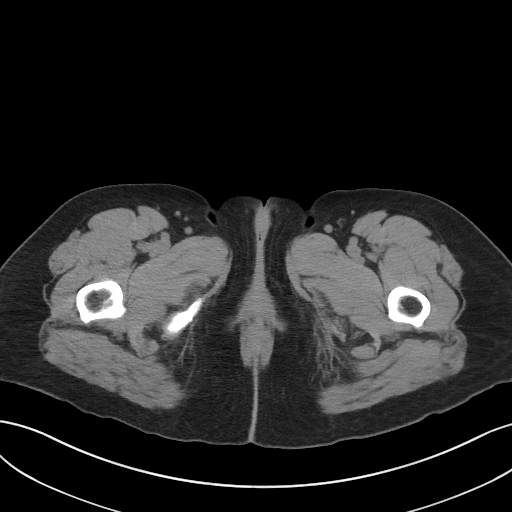
[im 5/93  bone]
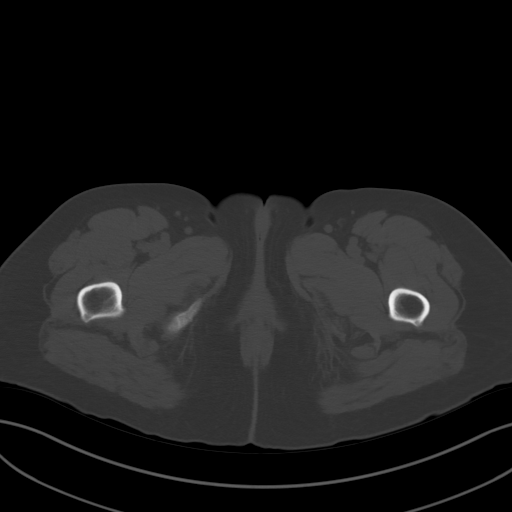
[im 13/93  soft-tissue]
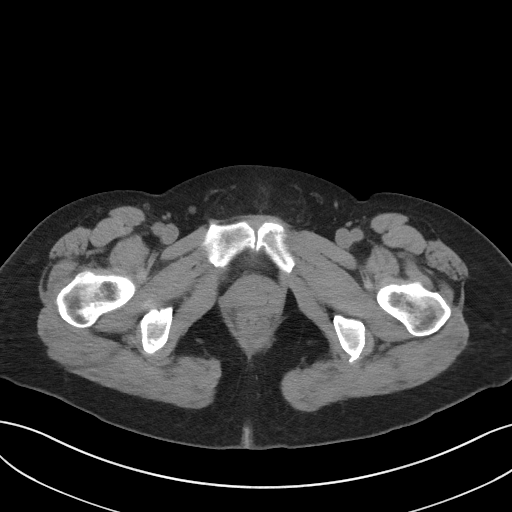
[im 21/93  soft-tissue]
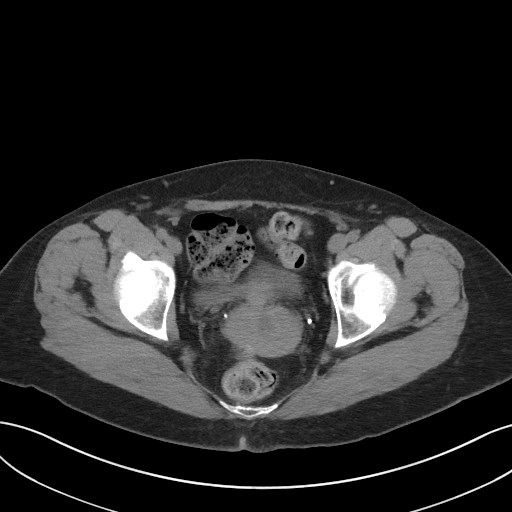
[im 25/93  soft-tissue]
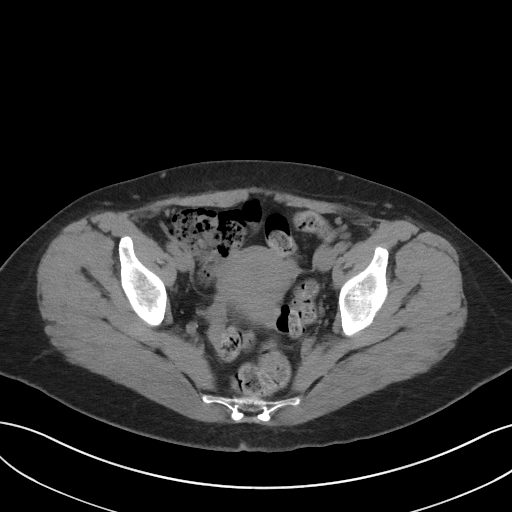
[im 33/93  soft-tissue]
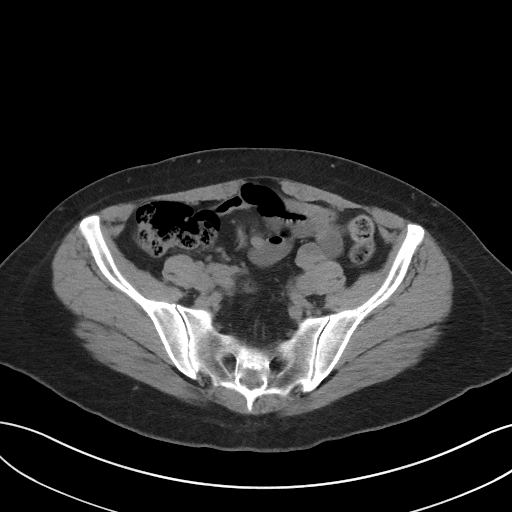
[im 41/93  soft-tissue]
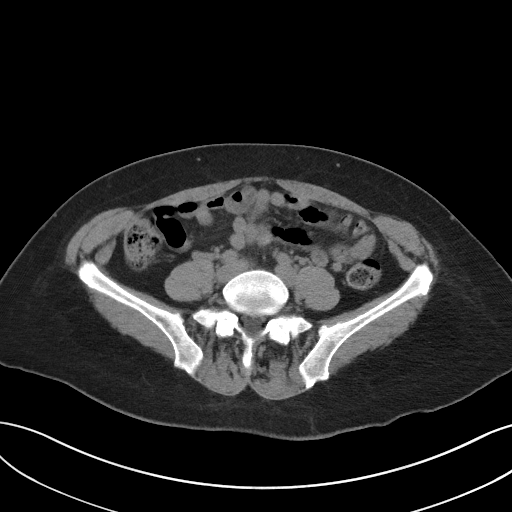
[im 49/93  soft-tissue]
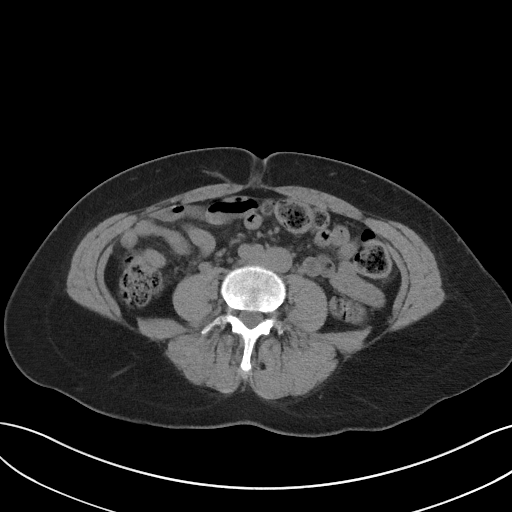
[im 53/93  soft-tissue]
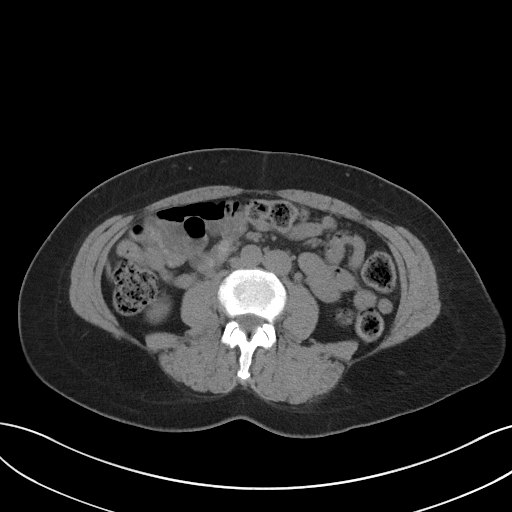
[im 61/93  soft-tissue]
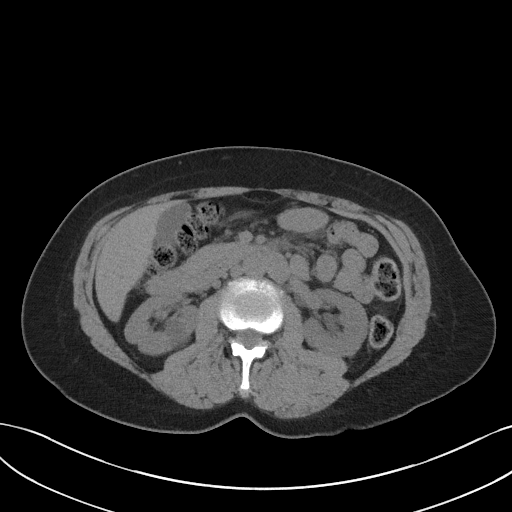
[im 61/93  bone]
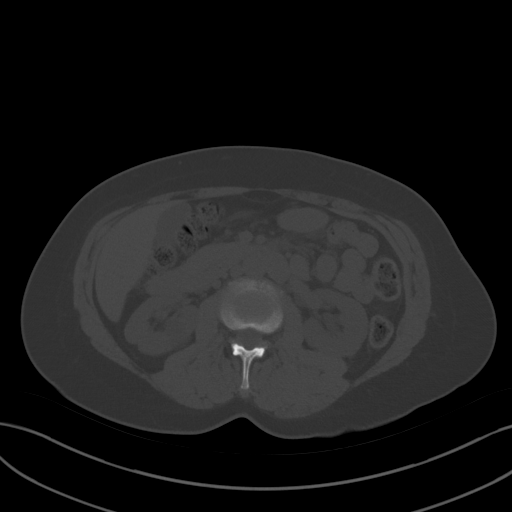
[im 69/93  soft-tissue]
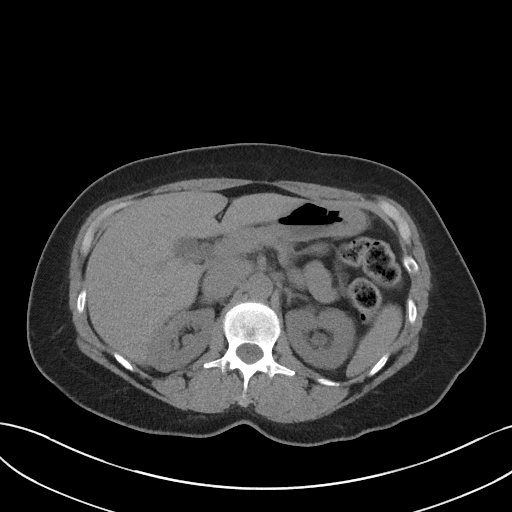
[im 73/93  soft-tissue]
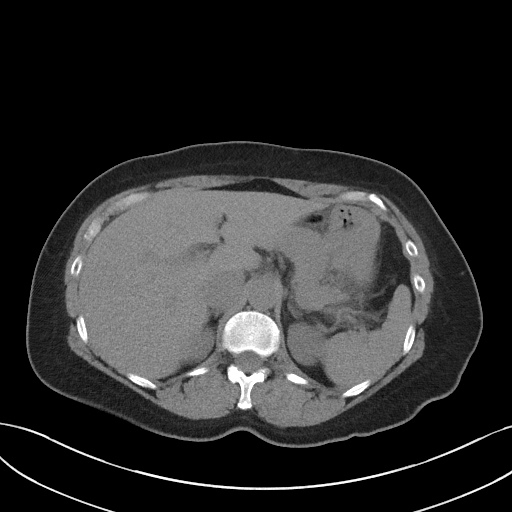
[im 81/93  soft-tissue]
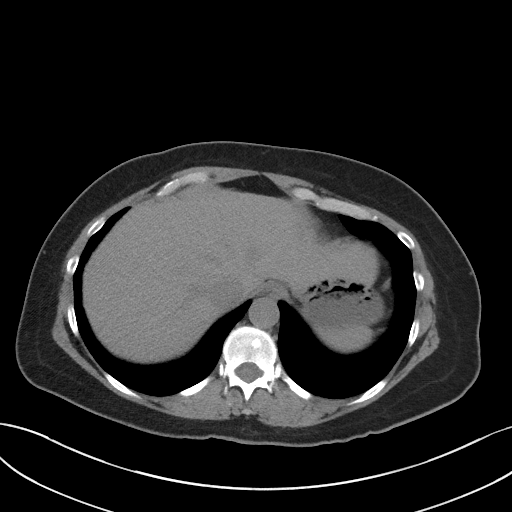
[im 89/93  soft-tissue]
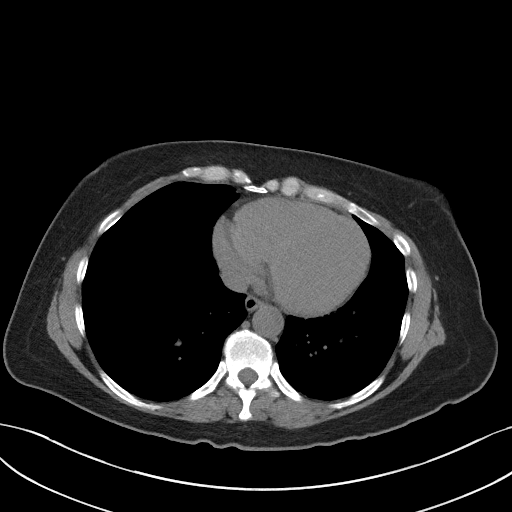

[Series 5: coronal st · coronal · 0.78mm/px · 3 of 78 slices shown]
[im 26/78  soft-tissue]
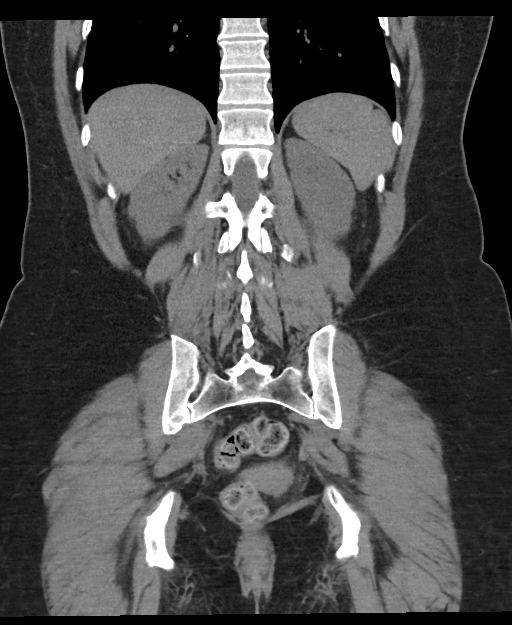
[im 35/78  soft-tissue]
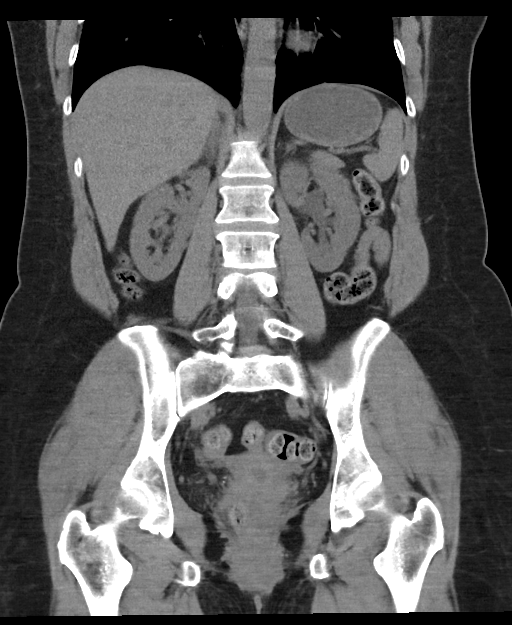
[im 43/78  soft-tissue]
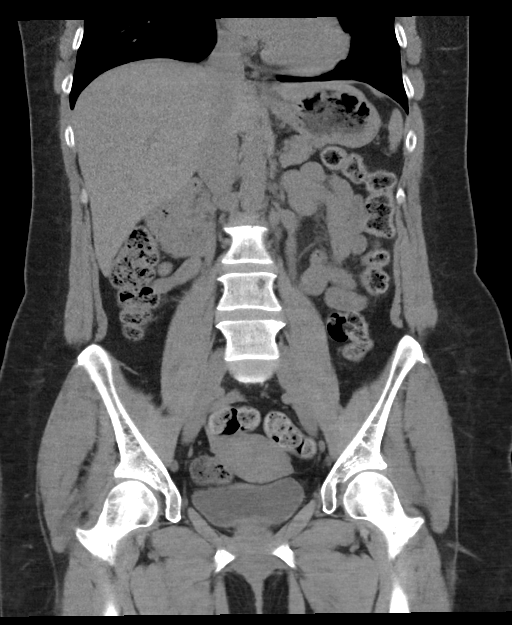

[16 of 46 positions shown; findings below may reference images not displayed]

FINDINGS: Lower chest: The visualized heart size within normal limits. No
pericardial fluid/thickening.

No hiatal hernia.

The visualized portions of the lungs are clear.

Hepatobiliary: Although limited due to the lack of intravenous
contrast, normal in appearance without gross focal abnormality. No
evidence of calcified gallstones or biliary ductal dilatation.

Pancreas:  Unremarkable.  No surrounding inflammatory changes.

Spleen: Normal in size. Although limited due to the lack of
intravenous contrast, normal in appearance.

Adrenals/Urinary Tract: Both adrenal glands appear normal. There is
mild left-sided pelviectasis and proximal ureterectasis to the level
of the proximal ureter where there is a punctate 2 mm calculus. Mild
proximal left periureteral stranding is seen. No right-sided renal
or collecting system calculi are noted. The bladder is unremarkable.
Within the upper pole of the right kidney there is a 2 cm
low-density lesion seen within the upper pole.

Stomach/Bowel: The stomach, small bowel, and colon are normal in
appearance. No inflammatory changes or obstructive findings. There
is a moderate amount of colonic stool present.

Vascular/Lymphatic: There are no enlarged abdominal or pelvic lymph
nodes. No significant gross vascular findings are present.

Reproductive:  The uterus and adnexa are unremarkable.

Other: No evidence of abdominal wall mass or hernia.

Musculoskeletal: No acute or significant osseous findings.
IMPRESSION: Punctate 2 mm proximal left ureteral calculus causing mild left
pelviectasis with stranding.

## 2021-01-14 DIAGNOSIS — B001 Herpesviral vesicular dermatitis: Secondary | ICD-10-CM | POA: Diagnosis not present

## 2021-01-14 DIAGNOSIS — Z01419 Encounter for gynecological examination (general) (routine) without abnormal findings: Secondary | ICD-10-CM | POA: Diagnosis not present

## 2021-01-15 DIAGNOSIS — M542 Cervicalgia: Secondary | ICD-10-CM | POA: Diagnosis not present

## 2021-01-20 DIAGNOSIS — R200 Anesthesia of skin: Secondary | ICD-10-CM | POA: Diagnosis not present

## 2021-01-28 DIAGNOSIS — Z1231 Encounter for screening mammogram for malignant neoplasm of breast: Secondary | ICD-10-CM | POA: Diagnosis not present

## 2021-01-28 LAB — HM MAMMOGRAPHY

## 2021-03-01 DIAGNOSIS — R200 Anesthesia of skin: Secondary | ICD-10-CM | POA: Diagnosis not present

## 2021-03-03 ENCOUNTER — Encounter: Payer: Self-pay | Admitting: Internal Medicine

## 2021-03-04 DIAGNOSIS — G5622 Lesion of ulnar nerve, left upper limb: Secondary | ICD-10-CM | POA: Diagnosis not present

## 2021-05-06 ENCOUNTER — Ambulatory Visit: Payer: BC Managed Care – PPO | Attending: Internal Medicine

## 2021-05-06 ENCOUNTER — Ambulatory Visit (INDEPENDENT_AMBULATORY_CARE_PROVIDER_SITE_OTHER): Payer: BC Managed Care – PPO | Admitting: Internal Medicine

## 2021-05-06 ENCOUNTER — Encounter: Payer: Self-pay | Admitting: Internal Medicine

## 2021-05-06 VITALS — BP 120/68 | HR 57 | Temp 98.4°F | Resp 16 | Ht 63.0 in | Wt 161.5 lb

## 2021-05-06 DIAGNOSIS — E785 Hyperlipidemia, unspecified: Secondary | ICD-10-CM | POA: Diagnosis not present

## 2021-05-06 DIAGNOSIS — Z0000 Encounter for general adult medical examination without abnormal findings: Secondary | ICD-10-CM

## 2021-05-06 DIAGNOSIS — Z23 Encounter for immunization: Secondary | ICD-10-CM

## 2021-05-06 NOTE — Assessment & Plan Note (Signed)
Here for CPX ?Feels well, denies any problems at the moment. ?Vitamin D deficiency: On supplements, last level satisfactory. ?RTC 1 year ?

## 2021-05-06 NOTE — Progress Notes (Signed)
? ?Subjective:  ? ? Patient ID: Charlotte Petersen, female    DOB: 03/05/1965, 56 y.o.   MRN: [id number] ? ?DOS:  05/06/2021 ?Type of visit - description: CPX ? ?Here for CPX. ?Feeling great. ? ?Review of Systems ? ? ?A 14 point review of systems is negative  ? ? ?Past Medical History:  ?Diagnosis Date  ? Ankle fracture 2010  ? no surgery  ? Arthralgia of left temporomandibular joint   ? Dr. Jenne Pane  ? 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 L sided osteophyte complex compressing left C6 nerve and L hemicord  ? Radiculopathy, cervical   ? L sided  ? RLS (restless legs syndrome) 10/24/2012  ? all day long per pt.  ? ? ?Past Surgical History:  ?Procedure Laterality Date  ? CESAREAN SECTION    ? NECK SURGERY N/A 11/09/2015  ? Dr Randell Patient - fussion  ? PELVIC LAPAROSCOPY  1990s  ? ?Social History  ? ?Socioeconomic History  ? Marital status: Married  ?  Spouse name: Not on file  ? Number of children: 2  ? Years of education: Not on file  ? Highest education level: Not on file  ?Occupational History  ? 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 Smoking status: Never  ? Smokeless tobacco: Never  ?Vaping Use  ? Vaping Use: Never used  ?Substance and Sexual Activity  ? Alcohol use: Yes  ?  Alcohol/week: 7.0 standard drinks  ?  Types: 7 Glasses of wine per week  ? Drug use: No  ? Sexual activity: Not on file  ?Other Topics Concern  ? Not on file  ?Social History Narrative  ? Live w/ husband   ? Daughter got married summer 2019,  [place]  ? Son married lives in [place]   ?    ? ?Social Determinants of Health  ? ?Financial Resource Strain: Not on file  ?Food Insecurity: Not on file  ?Transportation Needs: Not on file  ?Physical Activity: Not on file  ?Stress: Not on file  ?Social Connections: Not on file  ?Intimate Partner Violence: Not on file  ? ? ?Current Outpatient Medications  ?Medication Instructions  ? b complex vitamins tablet 1 tablet, Oral, Daily,    ? cholecalciferol (VITAMIN  D) 1,000 Units, Oral, Daily  ? Lysine 500 MG CAPS 1 capsule, Oral, Daily,    ? Multiple Vitamins-Minerals (MULTIVITAMIN GUMMIES ADULT PO) 2 tablets, Oral, Daily  ? ? ?   ?Objective:  ? Physical Exam ?BP 120/68 (BP Location: Left Arm, Patient Position: Sitting, Cuff Size: Small)   Pulse (!) 57   Temp 98.4 ?F (36.9 ?C) (Oral)   Resp 16   Ht 5\' 3"  (1.6 m)   Wt 161 lb 8 oz (73.3 kg)   SpO2 98%   BMI 28.61 kg/m?  ?General: ?Well developed, NAD, BMI noted ?Neck: No  thyromegaly  ?HEENT:  ?Normocephalic . Face symmetric, atraumatic ?Lungs:  ?CTA B ?Normal respiratory effort, no intercostal retractions, no accessory muscle use. ?Heart: RRR,  no murmur.  ?Abdomen:  ?Not distended, soft, non-tender. No rebound or rigidity.  Palpable nontender aorta ?Lower extremities: no pretibial edema bilaterally  ?Skin: Exposed areas without rash. Not pale. Not jaundice ?Neurologic:  ?alert & oriented X3.  ?Speech normal, gait appropriate for age and unassisted ?Strength symmetric and appropriate for age.  ?Psych: ?Cognition and judgment appear intact.  ?Cooperative with normal attention span and concentration.  ?Behavior appropriate. ?No anxious  or depressed appearing. ? ?   ?Assessment   ? ? Assessment ?RLS  2013, used to see Dr. Allena Katz, gabapentin helped but was requiring increasing doses, patient decided to wean -off gabapentin. Failed to improve with tonic water. ?Ankle fracture 2010, no surgery ?Tinnitus , serous otitis referred to ENT 01-2015 ?Palpable Ao: 08-19-2007 (-) ?Vitamin D deficiency ?Neck surgery 10-2015- residual minimal problems at L arm ?Kidney Stone 7/20221 (first episode) ?  ? ?PLAN: ?Here for CPX ?Feels well, denies any problems at the moment. ?Vitamin D deficiency: On supplements, last level satisfactory. ?RTC 1 year ? ? ? ? ?This visit occurred during the SARS-CoV-2 public health emergency.  Safety protocols were in place, including screening questions prior to the visit, additional usage of staff PPE, and  extensive cleaning of exam room while observing appropriate contact time as indicated for disinfecting solutions.  ? ?

## 2021-05-06 NOTE — Assessment & Plan Note (Signed)
-   Td  2016 ?- shingrix x2 ?- covid shot: booster d/w pt ?- had a flu shot  ?-CCS: cscope 07/2017 Dr Christella Hartigan , 10 years per report ?-Female care per gyn; PAP 11-2018 ([facility])  MMGs 01-2021 ([facility]) ?-menopausal 2020, DEXA will be indicated 2025 ?- Palpable aorta, ultrasound 7- 07-2007 negative ?- Labs: CMP, FLP, CBC ?-Diet and exercise: Doing great, weight watchers, exercises 3 times a week ?-Advance care discussed ?  ?

## 2021-05-06 NOTE — Patient Instructions (Signed)
   GO TO THE LAB : Get the blood work     GO TO THE FRONT DESK, PLEASE SCHEDULE YOUR APPOINTMENTS Come back for   a physical exam in 1 year   "Living will", "Health Care Power of attorney": Advanced care planning  (If you already have a living will or healthcare power of attorney, please bring the copy to be scanned in your chart.)  Advance care planning is a process that supports adults in  understanding and sharing their preferences regarding future medical care.   The patient's preferences are recorded in documents called Advance Directives.    Advanced directives are completed (and can be modified at any time) while the patient is in full mental capacity.   The documentation should be available at all times to the patient, the family and the healthcare providers.  Bring in a copy to be scanned in your chart is an excellent idea and is recommended   This legal documents direct treatment decision making and/or appoint a surrogate to make the decision if the patient is not capable to do so.    Advance directives can be documented in many types of formats,  documents have names such as:  Lliving will  Durable power of attorney for healthcare (healthcare proxy or healthcare power of attorney)  Combined directives  Physician orders for life-sustaining treatment    More information at:  [email or web address]  

## 2021-05-07 LAB — COMPREHENSIVE METABOLIC PANEL
AG Ratio: 1.6 (calc) (ref 1.0–2.5)
ALT: 19 U/L (ref 6–29)
AST: 20 U/L (ref 10–35)
Albumin: 4.5 g/dL (ref 3.6–5.1)
Alkaline phosphatase (APISO): 63 U/L (ref 37–153)
BUN: 17 mg/dL (ref 7–25)
CO2: 27 mmol/L (ref 20–32)
Calcium: 9.8 mg/dL (ref 8.6–10.4)
Chloride: 103 mmol/L (ref 98–110)
Creat: 0.82 mg/dL (ref 0.50–1.03)
Globulin: 2.8 g/dL (calc) (ref 1.9–3.7)
Glucose, Bld: 84 mg/dL (ref 65–99)
Potassium: 4.3 mmol/L (ref 3.5–5.3)
Sodium: 140 mmol/L (ref 135–146)
Total Bilirubin: 0.7 mg/dL (ref 0.2–1.2)
Total Protein: 7.3 g/dL (ref 6.1–8.1)

## 2021-05-07 LAB — CBC WITH DIFFERENTIAL/PLATELET
Absolute Monocytes: 672 cells/uL (ref 200–950)
Basophils Absolute: 73 cells/uL (ref 0–200)
Basophils Relative: 0.9 %
Eosinophils Absolute: 170 cells/uL (ref 15–500)
Eosinophils Relative: 2.1 %
HCT: 40.3 % (ref 35.0–45.0)
Hemoglobin: 13.6 g/dL (ref 11.7–15.5)
Lymphs Abs: 2592 cells/uL (ref 850–3900)
MCH: 30.5 pg (ref 27.0–33.0)
MCHC: 33.7 g/dL (ref 32.0–36.0)
MCV: 90.4 fL (ref 80.0–100.0)
MPV: 10.1 fL (ref 7.5–12.5)
Monocytes Relative: 8.3 %
Neutro Abs: 4593 cells/uL (ref 1500–7800)
Neutrophils Relative %: 56.7 %
Platelets: 275 10*3/uL (ref 140–400)
RBC: 4.46 10*6/uL (ref 3.80–5.10)
RDW: 12.1 % (ref 11.0–15.0)
Total Lymphocyte: 32 %
WBC: 8.1 10*3/uL (ref 3.8–10.8)

## 2021-05-07 LAB — LIPID PANEL
Cholesterol: 226 mg/dL — ABNORMAL HIGH (ref <200)
HDL: 63 mg/dL (ref >=50)
LDL Cholesterol (Calc): 140 mg/dL (calc) — ABNORMAL HIGH
Non-HDL Cholesterol (Calc): 163 mg/dL (calc) — ABNORMAL HIGH (ref <130)
Total CHOL/HDL Ratio: 3.6 (calc) (ref <5.0)
Triglycerides: 113 mg/dL (ref <150)

## 2021-05-11 NOTE — Progress Notes (Signed)
? ?  Covid-19 Vaccination Clinic ? ?Name:  Charlotte Petersen    ?MRN: [id number] ?DOB: [birth year] ? ?05/11/2021 ? ?Ms. Giraldo was observed post Covid-19 immunization for 15 minutes without incident. She was provided with Vaccine Information Sheet and instruction to access the V-Safe system.  ? ?Ms. Haeberle was instructed to call 911 with any severe reactions post vaccine: ?Difficulty breathing  ?Swelling of face and throat  ?A fast heartbeat  ?A bad rash all over body  ?Dizziness and weakness  ? ?Immunizations Administered   ? ? Name Date Dose VIS Date Route  ? Art gallery manager Booster 05/06/2021  2:45 PM 0.3 mL 10/13/2020 Intramuscular  ? Manufacturer: [employer]  ? Lot: [id number]  ? NDC: [phone]  ? ?  ? ? ?

## 2021-05-12 ENCOUNTER — Other Ambulatory Visit (HOSPITAL_BASED_OUTPATIENT_CLINIC_OR_DEPARTMENT_OTHER): Payer: Self-pay

## 2021-05-12 MED ORDER — PFIZER COVID-19 VAC BIVALENT 30 MCG/0.3ML IM SUSP
INTRAMUSCULAR | 0 refills | Status: DC
Start: 2021-05-06 — End: 2022-07-21
  Filled 2021-05-12: qty 0.3, 1d supply, fill #0

## 2021-12-09 DIAGNOSIS — Z23 Encounter for immunization: Secondary | ICD-10-CM | POA: Diagnosis not present

## 2022-02-24 DIAGNOSIS — Z01419 Encounter for gynecological examination (general) (routine) without abnormal findings: Secondary | ICD-10-CM | POA: Diagnosis not present

## 2022-02-24 DIAGNOSIS — Z1151 Encounter for screening for human papillomavirus (HPV): Secondary | ICD-10-CM | POA: Diagnosis not present

## 2022-03-03 DIAGNOSIS — Z1231 Encounter for screening mammogram for malignant neoplasm of breast: Secondary | ICD-10-CM | POA: Diagnosis not present

## 2022-03-03 LAB — HM MAMMOGRAPHY

## 2022-05-12 ENCOUNTER — Encounter: Payer: BC Managed Care – PPO | Admitting: Internal Medicine

## 2022-05-23 ENCOUNTER — Encounter: Payer: Self-pay | Admitting: Internal Medicine

## 2022-06-09 ENCOUNTER — Encounter: Payer: BC Managed Care – PPO | Admitting: Internal Medicine

## 2022-07-14 ENCOUNTER — Encounter: Payer: Self-pay | Admitting: Internal Medicine

## 2022-07-21 ENCOUNTER — Ambulatory Visit (INDEPENDENT_AMBULATORY_CARE_PROVIDER_SITE_OTHER): Payer: BC Managed Care – PPO | Admitting: Internal Medicine

## 2022-07-21 ENCOUNTER — Other Ambulatory Visit (HOSPITAL_BASED_OUTPATIENT_CLINIC_OR_DEPARTMENT_OTHER): Payer: Self-pay

## 2022-07-21 ENCOUNTER — Encounter: Payer: Self-pay | Admitting: Internal Medicine

## 2022-07-21 VITALS — BP 124/68 | HR 56 | Temp 98.4°F | Resp 16 | Ht 63.0 in | Wt 165.4 lb

## 2022-07-21 DIAGNOSIS — E559 Vitamin D deficiency, unspecified: Secondary | ICD-10-CM | POA: Diagnosis not present

## 2022-07-21 DIAGNOSIS — Z0000 Encounter for general adult medical examination without abnormal findings: Secondary | ICD-10-CM

## 2022-07-21 MED ORDER — COMIRNATY 30 MCG/0.3ML IM SUSY
PREFILLED_SYRINGE | INTRAMUSCULAR | 0 refills | Status: DC
  Filled 2022-07-21: qty 0.3, 1d supply, fill #0

## 2022-07-21 NOTE — Patient Instructions (Addendum)
Vaccines I recommend: Covid booster Flu shot this fall   GO TO THE LAB : Get the blood work     GO TO THE FRONT DESK, PLEASE SCHEDULE YOUR APPOINTMENTS Come back for physical exam in 1 year    "Health Care Power of attorney" ,  "Living will" (Advance care planning documents)  If you already have a living will or healthcare power of attorney, is recommended you bring the copy to be scanned in your chart.   The document will be available to all the doctors you see in the system.  Advance care planning is a process that supports adults in  understanding and sharing their preferences regarding future medical care.  The patient's preferences are recorded in documents called Advance Directives and the can be modified at any time while the patient is in full mental capacity.   If you don't have one, please consider create one.      More information at: [email or web address]

## 2022-07-21 NOTE — Progress Notes (Unsigned)
Subjective:    Patient ID: Charlotte Petersen, female    DOB: July 26, 1965, 57 y.o.   MRN: [id number]  DOS:  07/21/2022 Type of visit - description: CPX Here for CPX, doing well.  Has no major concerns.   Review of Systems See above   Past Medical History:  Diagnosis Date   Ankle fracture 2010   no surgery   Arthralgia of left temporomandibular joint    Dr. 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    Past Surgical History:  Procedure Laterality Date   CESAREAN SECTION     NECK SURGERY N/A 11/09/2015   Dr Randell Patient - fussion   PELVIC LAPAROSCOPY  1990s    Current Outpatient Medications  Medication Instructions   b complex vitamins tablet 1 tablet, Oral, Daily,     cholecalciferol (VITAMIN D) 1,000 Units, Oral, Daily   Lysine 500 MG CAPS 1 capsule, Oral, Daily,     Multiple Vitamins-Minerals (MULTIVITAMIN GUMMIES ADULT PO) 2 tablets, Oral, Daily       Objective:   Physical Exam BP 124/68   Pulse (!) 56   Temp 98.4 F (36.9 C) (Oral)   Resp 16   Ht 5\' 3"  (1.6 m)   Wt 165 lb 6 oz (75 kg)   SpO2 97%   BMI 29.29 kg/m  General: Well developed, NAD, BMI noted Neck: No  thyromegaly  HEENT:  Normocephalic . Face symmetric, atraumatic Lungs:  CTA B Normal respiratory effort, no intercostal retractions, no accessory muscle use. Heart: RRR,  no murmur.  Abdomen:  Not distended, soft, non-tender. No rebound or rigidity.   Lower extremities: no pretibial edema bilaterally  Skin: Exposed areas without rash. Not pale. Not jaundice Neurologic:  alert & oriented X3.  Speech normal, gait appropriate for age and unassisted Strength symmetric and appropriate for age.  Psych: Cognition and judgment appear intact.  Cooperative with normal attention span and concentration.   Behavior appropriate. No anxious or depressed appearing.     Assessment     Assessment RLS  2013, used to see Dr. Allena Katz, gabapentin helped but was requiring increasing doses, patient decided to wean -off gabapentin. Failed to improve with tonic water. Ankle fracture 2010, no surgery Tinnitus , serous otitis referred to ENT 01-2015 Palpable Ao: 08-19-2007 (-) Vitamin D deficiency Neck surgery 10-2015- residual minimal problems at L arm Kidney Stone 7/20221 (first episode)    PLAN: Here for CPX Feeling well. RTC 1 year -Td  2016 - shingrix x2 -Vaccines I recommend: COVID booster, flu shot this fall. -CCS: cscope 07/2017 Dr Christella Hartigan , 10 years per report -Female care per gyn; PAP 11-2018 ([facility])  MMGs 02-2022 ([facility]) -menopausal 2020, DEXA will be indicated 2025 - Palpable aorta, ultrasound 7- 07-2007 negative - Labs: BMP FLP CBC vitamin D -Diet and exercise: Has a home gym, exercises regularly. -Advance care discussed   The 10-year ASCVD risk score (Arnett DK, et al., 2019) is: 1.9%   Values used to calculate the score:     Age: 17 years     Sex: Female     Is Non-Hispanic African American: No     Diabetic: No     Tobacco smoker: No     Systolic Blood Pressure: 124 mmHg  Is BP treated: No     HDL Cholesterol: 63 mg/dL     Total Cholesterol: 226 mg/dL    Feels well, denies any problems at the moment. Vitamin D deficiency: On supplements, last level satisfactory. RTC 1 year

## 2022-07-22 LAB — BASIC METABOLIC PANEL
BUN: 18 mg/dL (ref 7–25)
CO2: 25 mmol/L (ref 20–32)
Calcium: 9.1 mg/dL (ref 8.6–10.4)
Chloride: 104 mmol/L (ref 98–110)
Creat: 0.77 mg/dL (ref 0.50–1.03)
Glucose, Bld: 84 mg/dL (ref 65–99)
Potassium: 4.1 mmol/L (ref 3.5–5.3)
Sodium: 140 mmol/L (ref 135–146)

## 2022-07-22 LAB — CBC WITH DIFFERENTIAL/PLATELET
Absolute Monocytes: 655 cells/uL (ref 200–950)
Basophils Absolute: 78 cells/uL (ref 0–200)
Basophils Relative: 1 %
Eosinophils Absolute: 281 cells/uL (ref 15–500)
Eosinophils Relative: 3.6 %
HCT: 38.6 % (ref 35.0–45.0)
Hemoglobin: 13.4 g/dL (ref 11.7–15.5)
Lymphs Abs: 2590 cells/uL (ref 850–3900)
MCH: 31.2 pg (ref 27.0–33.0)
MCHC: 34.7 g/dL (ref 32.0–36.0)
MCV: 89.8 fL (ref 80.0–100.0)
MPV: 10.2 fL (ref 7.5–12.5)
Monocytes Relative: 8.4 %
Neutro Abs: 4196 cells/uL (ref 1500–7800)
Neutrophils Relative %: 53.8 %
Platelets: 265 10*3/uL (ref 140–400)
RBC: 4.3 10*6/uL (ref 3.80–5.10)
RDW: 12.8 % (ref 11.0–15.0)
Total Lymphocyte: 33.2 %
WBC: 7.8 10*3/uL (ref 3.8–10.8)

## 2022-07-22 LAB — LIPID PANEL
Cholesterol: 231 mg/dL — ABNORMAL HIGH (ref <200)
HDL: 64 mg/dL (ref >=50)
LDL Cholesterol (Calc): 144 mg/dL (calc) — ABNORMAL HIGH
Non-HDL Cholesterol (Calc): 167 mg/dL (calc) — ABNORMAL HIGH (ref <130)
Total CHOL/HDL Ratio: 3.6 (calc) (ref <5.0)
Triglycerides: 112 mg/dL (ref <150)

## 2022-07-22 LAB — VITAMIN D 25 HYDROXY (VIT D DEFICIENCY, FRACTURES): Vit D, 25-Hydroxy: 32 ng/mL (ref 30–100)

## 2022-07-23 ENCOUNTER — Encounter: Payer: Self-pay | Admitting: Internal Medicine

## 2022-07-23 NOTE — Assessment & Plan Note (Signed)
Here for CPX Feeling well. RTC 1 year 

## 2022-07-23 NOTE — Assessment & Plan Note (Signed)
-  Td  2016 - s/p shingrix x2 -Vaccines I recommend: COVID booster, flu shot this fall. -CCS: cscope 07/2017 Dr Christella Hartigan , 10 years per report -Female care per gyn; PAP 11-2018 ([facility])  MMGs 02-2022 ([facility]) -menopausal 2020, DEXA will be indicated 2025 - Labs: BMP FLP CBC vitamin D -Diet and exercise: Has a home gym, exercises regularly. -Advance care discussed

## 2022-07-24 DIAGNOSIS — H2513 Age-related nuclear cataract, bilateral: Secondary | ICD-10-CM | POA: Insufficient documentation

## 2022-11-24 DIAGNOSIS — Z23 Encounter for immunization: Secondary | ICD-10-CM | POA: Diagnosis not present

## 2023-03-07 ENCOUNTER — Ambulatory Visit: Payer: Self-pay | Admitting: Internal Medicine

## 2023-03-07 ENCOUNTER — Ambulatory Visit: Payer: BC Managed Care – PPO | Admitting: Physician Assistant

## 2023-03-07 VITALS — BP 158/90 | HR 53 | Temp 98.6°F | Ht 63.0 in | Wt 174.1 lb

## 2023-03-07 DIAGNOSIS — R42 Dizziness and giddiness: Secondary | ICD-10-CM

## 2023-03-07 DIAGNOSIS — H6992 Unspecified Eustachian tube disorder, left ear: Secondary | ICD-10-CM | POA: Diagnosis not present

## 2023-03-07 MED ORDER — MECLIZINE HCL 25 MG PO TABS
25.0000 mg | ORAL_TABLET | Freq: Three times a day (TID) | ORAL | 0 refills | Status: DC | PRN
Start: 2023-03-07 — End: 2023-08-10

## 2023-03-07 MED ORDER — AZELASTINE HCL 0.1 % NA SOLN
1.0000 | Freq: Two times a day (BID) | NASAL | 1 refills | Status: DC
Start: 2023-03-07 — End: 2023-08-10

## 2023-03-07 NOTE — Telephone Encounter (Signed)
  Chief Complaint: vertigo, left earache Symptoms: tinnitus, left ear ache, dull headache, vertigo, nausea Frequency: x 4 days Pertinent Negatives: Patient denies weakness, numbness, changes or loss of speech Disposition: [] ED /[] Urgent Care (no appt availability in office) / [x] Appointment(In office/virtual)/ []  [facility]/ [] Home Care/ [] Refused Recommended Disposition /[] [facility]/ []  Follow-up with PCP Additional Notes: Patient states she had rolled over in bed on Sunday and that started her vertigo. She states since then she has to try to focus on one object cause she feels like the room is spinning around. Patient states it was severe Sunday but states it is mild today. Patient states she has had a chronic problem with her left ear but recently it has been aching and experiencing tinnitus in the left ear. Patient states she felt nausea after taking OTC ibuprofen for her headache so she is not treating the dull headache to avoid nausea.  Copied from CRM [id number]. Topic: Clinical - Red Word Triage >> Mar 07, 2023 10:50 AM Irine Seal wrote: [facility] that prompted transfer to Nurse Triage: vertigo, and left ear is just crackly and when moving ears. Pt stated when bending over and looking up she is severely disoriented. Present since Sunday Reason for Disposition  Earache  Answer Assessment - Initial Assessment Questions 1. DESCRIPTION: "Describe your dizziness."     "It's almost like my eyes wont stay still and the room is spinning and I can't focus on anything".  2. VERTIGO: "Do you feel like either you or the room is spinning or tilting?"      Room is spinning.  3. LIGHTHEADED: "Do you feel lightheaded?" (e.g., somewhat faint, woozy, weak upon standing)     Denies.  4. SEVERITY: "How bad is it?"  "Can you walk?"   - MILD: Feels slightly dizzy and unsteady, but is walking normally.   - MODERATE: Feels unsteady when walking, but not falling; interferes with normal  activities (e.g., school, work).   - SEVERE: Unable to walk without falling, or requires assistance to walk without falling.     Mild.  5. ONSET:  "When did the dizziness begin?"     Started Sunday; comes and goes since. Patient states as she stayed up right it would subside.  6. AGGRAVATING FACTORS: "Does anything make it worse?" (e.g., standing, change in head position)     Bending over, changing you head position, or looking down and then up.  7. CAUSE: "What do you think is causing the dizziness?"     Patient states her left ear has been bothering her significantly.  8. RECURRENT SYMPTOM: "Have you had dizziness before?" If Yes, ask: "When was the last time?" "What happened that time?"     Patient states she has a history of vertigo  9. OTHER SYMPTOMS: "Do you have any other symptoms?" (e.g., headache, weakness, numbness, vomiting, earache)     Nausea, left earache and "crunching noises", headache.  Protocols used: Dizziness - Vertigo-A-AH

## 2023-03-07 NOTE — Progress Notes (Signed)
Established patient visit   Patient: Charlotte Petersen   DOB: 04/24/1965   58 y.o. Female  MRN: [id number] Visit Date: 03/07/2023  Today's healthcare provider: Alfredia Ferguson, PA-C   Chief Complaint  Patient presents with   Tinnitus    Vertigo and dull headache since Sunday- Ears are clicking when chewing and talking Headaches are mainly with her dizziness OTC- tylenol When she wakes up its bad but thru out the day it gets a little better   Subjective     Patient reports new onset of severe dizziness/vertigo, left ear fullness/cracking and headaches that started this past weekend, 4 to 5 days.  Reports she woke up on Saturday to severe vertigo feeling nauseous and having a headache.  It persisted during the day, worse at night when she is laying down or turning over.  It has improved slightly but she still is having issues with her left ear hearing a "crunching or "clicking noise and dizziness with certain head movements.   Medications: Outpatient Medications Prior to Visit  Medication Sig   b complex vitamins tablet Take 1 tablet by mouth daily.   cholecalciferol (VITAMIN D) 1000 units tablet Take 1,000 Units by mouth daily.   COVID-19 mRNA vaccine 2023-2024 (COMIRNATY) syringe Inject into the muscle.   Lysine 500 MG CAPS Take 1 capsule by mouth daily.   Multiple Vitamins-Minerals (MULTIVITAMIN GUMMIES ADULT PO) Take 2 tablets by mouth daily.    No facility-administered medications prior to visit.    Review of Systems  Constitutional:  Negative for fatigue and fever.  Respiratory:  Negative for cough and shortness of breath.   Cardiovascular:  Negative for chest pain and leg swelling.  Gastrointestinal:  Positive for nausea. Negative for abdominal pain.  Neurological:  Positive for dizziness and headaches.       Objective    BP (!) 158/90   Pulse (!) 53   Temp 98.6 F (37 C) (Oral)   Ht 5\' 3"  (1.6 m)   Wt 174 lb 2 oz (79 kg)   SpO2 95%   BMI 30.84 kg/m     Physical Exam Constitutional:      General: She is awake.     Appearance: She is well-developed.  HENT:     Head: Normocephalic.     Right Ear: Tympanic membrane normal.     Left Ear: Tympanic membrane normal.  Eyes:     Conjunctiva/sclera: Conjunctivae normal.  Cardiovascular:     Rate and Rhythm: Normal rate and regular rhythm.     Heart sounds: Normal heart sounds.  Pulmonary:     Effort: Pulmonary effort is normal.     Breath sounds: Normal breath sounds.  Skin:    General: Skin is warm.  Neurological:     Mental Status: She is alert and oriented to person, place, and time.  Psychiatric:        Attention and Perception: Attention normal.        Mood and Affect: Mood normal.        Speech: Speech normal.        Behavior: Behavior is cooperative.      No results found for any visits on 03/07/23.  Assessment & Plan    Vertigo -     Meclizine HCl; Take 1 tablet (25 mg total) by mouth 3 (three) times daily as needed for dizziness.  Dispense: 30 tablet; Refill: 0 -     Azelastine HCl; Place 1 spray into both  nostrils 2 (two) times daily. Use in each nostril as directed  Dispense: 30 mL; Refill: 1  Eustachian tube dysfunction, left  Dix-Hallpike maneuver in office without a confirmatory positive.  She did experience dizziness with positional changes.  Given instructional information on the Epley maneuver, explained BPPV and pathology of other similar vertigo d/o.  Prescribed meclizine up to 3 times daily for dizziness and azelastine to help with some eustachian tube dysfunction.  Advised if symptoms do not improve can refer to vestibular physical therapy.   Return if symptoms worsen or fail to improve.       Alfredia Ferguson, PA-C  [facility] [phone] (phone) [phone] (fax)  [facility]

## 2023-03-30 LAB — HM PAP SMEAR
HPV 16: NEGATIVE
HPV 18 / 45: NEGATIVE
HPV, high-risk: NEGATIVE

## 2023-03-30 LAB — RESULTS CONSOLE HPV: CHL HPV: NEGATIVE

## 2023-04-06 LAB — HM MAMMOGRAPHY

## 2023-05-31 ENCOUNTER — Encounter: Payer: Self-pay | Admitting: Internal Medicine

## 2023-08-09 ENCOUNTER — Encounter: Payer: Self-pay | Admitting: Internal Medicine

## 2023-08-10 ENCOUNTER — Ambulatory Visit (INDEPENDENT_AMBULATORY_CARE_PROVIDER_SITE_OTHER): Payer: BC Managed Care – PPO | Admitting: Internal Medicine

## 2023-08-10 ENCOUNTER — Encounter: Payer: Self-pay | Admitting: Internal Medicine

## 2023-08-10 VITALS — BP 122/74 | HR 60 | Temp 98.4°F | Resp 16 | Ht 63.0 in | Wt 150.5 lb

## 2023-08-10 DIAGNOSIS — Z23 Encounter for immunization: Secondary | ICD-10-CM

## 2023-08-10 DIAGNOSIS — Z780 Asymptomatic menopausal state: Secondary | ICD-10-CM | POA: Diagnosis not present

## 2023-08-10 DIAGNOSIS — Z0000 Encounter for general adult medical examination without abnormal findings: Secondary | ICD-10-CM

## 2023-08-10 NOTE — Progress Notes (Unsigned)
 Subjective:    Patient ID: Charlotte Petersen, female    DOB: 02-24-1965, 58 y.o.   MRN: [id number]  DOS:  08/10/2023 Type of visit - description: CPX  Here for CPX. Feeling great.  Wt Readings from Last 3 Encounters:  08/10/23 150 lb 8 oz (68.3 kg)  03/07/23 174 lb 2 oz (79 kg)  07/21/22 165 lb 6 oz (75 kg)   Review of Systems  A 14 point review of systems is negative    Past Medical History:  Diagnosis Date   Ankle fracture 2010   no surgery   Arthralgia of left temporomandibular joint    Dr. 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)  Tobacco Use   Smoking status: Never   Smokeless tobacco: Never  Vaping Use   Vaping status: Never Used  Substance and Sexual Activity   Alcohol use: Yes    Alcohol/week: 7.0 standard drinks of alcohol    Types: 7 Glasses of wine per week   Drug use: No   Sexual activity: Not on file  Other Topics Concern   Not on file  Social History Narrative   Live w/ husband    Daughter got married summer 2019,  [place]   Son married lives in [place]        Social Drivers of Health   Financial Resource Strain: Low Risk  (03/07/2023)   Overall Financial Resource Strain (CARDIA)    Difficulty of Paying Living Expenses: Not hard at all  Food Insecurity: No Food Insecurity (03/07/2023)   Hunger  Vital Sign    Worried About Running Out of Food in the Last Year: Never true    Ran Out of Food in the Last Year: Never true  Transportation Needs: No Transportation Needs (03/07/2023)   PRAPARE - Administrator, Civil Service (Medical): No    Lack of Transportation (Non-Medical): No  Physical Activity: Sufficiently Active (03/07/2023)   Exercise Vital Sign    Days of Exercise per Week: 4 days    Minutes of Exercise per Session: 40 min  Stress: Stress Concern Present (03/07/2023)   [employer] of Occupational Health - Occupational Stress Questionnaire    Feeling of Stress : To some extent  Social Connections: Moderately Integrated (03/07/2023)   Social Connection and Isolation Panel    Frequency of Communication with Friends and Family: More than three times a week    Frequency of Social Gatherings with Friends and Family: Once a week    Attends Religious Services: More than 4 times per year    Active Member of [employer] or Organizations: No    Attends Banker Meetings: Not on file    Marital Status: Married  Catering manager  Violence: Not on file     Current Outpatient Medications  Medication Instructions   b complex vitamins tablet 1 tablet, Daily   cholecalciferol (VITAMIN D ) 1,000 Units, Daily   Lysine 500 MG CAPS 1 capsule, Daily   Multiple Vitamins-Minerals (MULTIVITAMIN GUMMIES ADULT PO) 2 tablets, Daily       Objective:   Physical Exam BP 122/74   Pulse 60   Temp 98.4 F (36.9 C) (Oral)   Resp 16   Ht 5' 3 (1.6 m)   Wt 150 lb 8 oz (68.3 kg)   SpO2 98%   BMI 26.66 kg/m  General: Well developed, NAD, BMI noted Neck: No  thyromegaly  HEENT:  Normocephalic . Face symmetric, atraumatic Lungs:  CTA B Normal respiratory effort, no intercostal retractions, no accessory muscle use. Heart: RRR,  no murmur.  Abdomen:  Not distended, soft, non-tender. No rebound or rigidity.   Lower extremities: no pretibial edema bilaterally  Skin:  Exposed areas without rash. Not pale. Not jaundice Neurologic:  alert & oriented X3.  Speech normal, gait appropriate for age and unassisted Strength symmetric and appropriate for age.  Psych: Cognition and judgment appear intact.  Cooperative with normal attention span and concentration.  Behavior appropriate. No anxious or depressed appearing.     Assessment     Assessment RLS  2013, used to see Dr. Tobie, gabapentin  helped but was requiring increasing doses, patient decided to wean -off gabapentin . Failed to improve with tonic water. Ankle fracture 2010, no surgery Tinnitus , serous otitis referred to ENT 01-2015 Palpable Ao: 08-19-2007 (-) Vitamin D  deficiency Neck surgery 10-2015- residual minimal problems at L arm Kidney Stone 7/20221 (first episode)    PLAN: Here for CPX -Td  2016 - s/p shingrix x2 -Vaccines I recommend: COVID booster, flu shot this fall. -CCS: cscope 07/2017 Dr Teressa , 10 years per report -Female care per gyn; PAP 11-2020 ([facility]) ; [facility]-03/2023 -menopausal 2020, rec baseline DEXA  - Labs: CMP FLP CBC TSH hep B titer -Diet and exercise: Doing great, following the [facility] diet, + weight loss, increase energy.  Praised  Other issues addressed RLS: Still having some symptoms, declined medications. Vitamin D  deficiency: On supplements, last level okay. RTC 1 year

## 2023-08-10 NOTE — Patient Instructions (Signed)
   We are referring you for a bone density test.  You can arrange it at the first floor  You are doing great with your diet and exercise.  Congratulations.  GO TO THE LAB :  Get the blood work   Your results will be posted on MyChart with my comments  Next office visit for a physical exam in 1 year Please make an appointment before you leave today

## 2023-08-11 ENCOUNTER — Encounter: Payer: Self-pay | Admitting: Internal Medicine

## 2023-08-11 LAB — CBC WITH DIFFERENTIAL/PLATELET
Absolute Lymphocytes: 2358 {cells}/uL (ref 850–3900)
Absolute Monocytes: 563 {cells}/uL (ref 200–950)
Basophils Absolute: 60 {cells}/uL (ref 0–200)
Basophils Relative: 0.9 %
Eosinophils Absolute: 161 {cells}/uL (ref 15–500)
Eosinophils Relative: 2.4 %
HCT: 42.7 % (ref 35.0–45.0)
Hemoglobin: 14.1 g/dL (ref 11.7–15.5)
MCH: 30.5 pg (ref 27.0–33.0)
MCHC: 33 g/dL (ref 32.0–36.0)
MCV: 92.2 fL (ref 80.0–100.0)
MPV: 10.3 fL (ref 7.5–12.5)
Monocytes Relative: 8.4 %
Neutro Abs: 3558 {cells}/uL (ref 1500–7800)
Neutrophils Relative %: 53.1 %
Platelets: 236 10*3/uL (ref 140–400)
RBC: 4.63 10*6/uL (ref 3.80–5.10)
RDW: 11.7 % (ref 11.0–15.0)
Total Lymphocyte: 35.2 %
WBC: 6.7 10*3/uL (ref 3.8–10.8)

## 2023-08-11 LAB — COMPREHENSIVE METABOLIC PANEL WITH GFR
AG Ratio: 1.9 (calc) (ref 1.0–2.5)
ALT: 17 U/L (ref 6–29)
AST: 20 U/L (ref 10–35)
Albumin: 5 g/dL (ref 3.6–5.1)
Alkaline phosphatase (APISO): 70 U/L (ref 37–153)
BUN: 15 mg/dL (ref 7–25)
CO2: 25 mmol/L (ref 20–32)
Calcium: 9.9 mg/dL (ref 8.6–10.4)
Chloride: 102 mmol/L (ref 98–110)
Creat: 0.81 mg/dL (ref 0.50–1.03)
Globulin: 2.6 g/dL (ref 1.9–3.7)
Glucose, Bld: 93 mg/dL (ref 65–99)
Potassium: 4.1 mmol/L (ref 3.5–5.3)
Sodium: 138 mmol/L (ref 135–146)
Total Bilirubin: 0.7 mg/dL (ref 0.2–1.2)
Total Protein: 7.6 g/dL (ref 6.1–8.1)
eGFR: 85 mL/min/{1.73_m2} (ref >=60)

## 2023-08-11 LAB — LIPID PANEL
Cholesterol: 211 mg/dL — ABNORMAL HIGH (ref <200)
HDL: 66 mg/dL (ref >=50)
LDL Cholesterol (Calc): 126 mg/dL — ABNORMAL HIGH
Non-HDL Cholesterol (Calc): 145 mg/dL — ABNORMAL HIGH (ref <130)
Total CHOL/HDL Ratio: 3.2 (calc) (ref <5.0)
Triglycerides: 90 mg/dL (ref <150)

## 2023-08-11 LAB — TSH: TSH: 2.39 m[IU]/L (ref 0.40–4.50)

## 2023-08-11 LAB — HEPATITIS B SURFACE ANTIBODY, QUANTITATIVE: Hep B S AB Quant (Post): <5 m[IU]/mL — ABNORMAL LOW (ref >=10)

## 2023-08-11 NOTE — Assessment & Plan Note (Signed)
 Here for CPX -Td  2016 - s/p shingrix x2 -Vaccines I recommend: COVID booster, flu shot this fall. -CCS: cscope 07/2017 Dr Teressa , 10 years per report -Female care per gyn; PAP 11-2020 ([facility]) ; [facility]-03/2023 -menopausal 2020, rec baseline DEXA  - Labs: CMP FLP CBC TSH hep B titer -Diet and exercise: Doing great, following the [facility] diet, + weight loss, increase energy.  Praised

## 2023-08-11 NOTE — Assessment & Plan Note (Signed)
 Here for CPX  Other issues addressed RLS: Still having some symptoms, declined medications. Vitamin D  deficiency: On supplements, last level okay. RTC 1 year

## 2023-08-14 ENCOUNTER — Ambulatory Visit: Payer: Self-pay | Admitting: Internal Medicine

## 2023-09-18 ENCOUNTER — Ambulatory Visit (HOSPITAL_BASED_OUTPATIENT_CLINIC_OR_DEPARTMENT_OTHER)
Admission: RE | Admit: 2023-09-18 | Discharge: 2023-09-18 | Disposition: A | Discharge status: Home / Self Care | Source: Ambulatory Visit | Attending: Internal Medicine | Admitting: Internal Medicine

## 2023-09-18 ENCOUNTER — Ambulatory Visit (INDEPENDENT_AMBULATORY_CARE_PROVIDER_SITE_OTHER)

## 2023-09-18 DIAGNOSIS — Z780 Asymptomatic menopausal state: Secondary | ICD-10-CM | POA: Diagnosis present

## 2023-09-18 DIAGNOSIS — Z23 Encounter for immunization: Secondary | ICD-10-CM

## 2023-09-18 NOTE — Progress Notes (Signed)
 Patient here today for hep b vaccine per PCP Dr.Jose Amon, MD.  Vaccine given in right deltoid , tolerated well.

## 2023-12-13 ENCOUNTER — Ambulatory Visit (INDEPENDENT_AMBULATORY_CARE_PROVIDER_SITE_OTHER)

## 2023-12-13 DIAGNOSIS — Z23 Encounter for immunization: Secondary | ICD-10-CM

## 2024-09-19 ENCOUNTER — Encounter: Admitting: Internal Medicine
